# Patient Record
Sex: Male | Born: 1960
Health system: Southern US, Community
[De-identification: ages and names within clinical notes are randomized; demographics above are authoritative.]

## PROBLEM LIST (undated history)

## (undated) DIAGNOSIS — F29 Unspecified psychosis not due to a substance or known physiological condition: Secondary | ICD-10-CM

## (undated) DIAGNOSIS — B1920 Unspecified viral hepatitis C without hepatic coma: Secondary | ICD-10-CM

## (undated) DIAGNOSIS — F1010 Alcohol abuse, uncomplicated: Secondary | ICD-10-CM

## (undated) HISTORY — PX: GASTROSTOMY W/ FEEDING TUBE: SUR642

## (undated) HISTORY — DX: Alcohol abuse, uncomplicated: F10.10

## (undated) HISTORY — PX: SPLENECTOMY: SUR1306

## (undated) HISTORY — PX: OTHER SURGICAL HISTORY: SHX169

## (undated) HISTORY — PX: CHEST TUBE INSERTION: SHX231

## (undated) HISTORY — PX: TRACHEOSTOMY: SUR1362

## (undated) HISTORY — PX: BELOW KNEE LEG AMPUTATION: SUR23

---

## 2000-08-26 ENCOUNTER — Inpatient Hospital Stay (HOSPITAL_COMMUNITY): Admission: EM | Admit: 2000-08-26 | Discharge: 2000-08-29 | Payer: Self-pay | Admitting: *Deleted

## 2004-01-09 ENCOUNTER — Emergency Department (HOSPITAL_COMMUNITY): Admission: EM | Admit: 2004-01-09 | Discharge: 2004-01-09 | Payer: Self-pay | Admitting: Emergency Medicine

## 2007-09-21 ENCOUNTER — Encounter: Payer: Self-pay | Admitting: Family Medicine

## 2008-04-15 ENCOUNTER — Ambulatory Visit: Payer: Self-pay | Admitting: Family Medicine

## 2008-04-19 DIAGNOSIS — F329 Major depressive disorder, single episode, unspecified: Secondary | ICD-10-CM | POA: Insufficient documentation

## 2008-04-19 DIAGNOSIS — I10 Essential (primary) hypertension: Secondary | ICD-10-CM | POA: Insufficient documentation

## 2008-04-19 DIAGNOSIS — F17200 Nicotine dependence, unspecified, uncomplicated: Secondary | ICD-10-CM | POA: Insufficient documentation

## 2010-10-20 NOTE — Letter (Signed)
Summary: rpc chart  rpc chart   Imported By: Curtis Sites 04/14/2010 12:11:50  _____________________________________________________________________  External Attachment:    Type:   Image     Comment:   External Document
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
## 2011-11-16 DIAGNOSIS — L439 Lichen planus, unspecified: Secondary | ICD-10-CM | POA: Diagnosis not present

## 2013-09-25 DIAGNOSIS — R079 Chest pain, unspecified: Secondary | ICD-10-CM | POA: Diagnosis not present

## 2013-09-25 DIAGNOSIS — R071 Chest pain on breathing: Secondary | ICD-10-CM | POA: Diagnosis not present

## 2013-09-25 DIAGNOSIS — R7309 Other abnormal glucose: Secondary | ICD-10-CM | POA: Diagnosis not present

## 2013-09-25 DIAGNOSIS — J4489 Other specified chronic obstructive pulmonary disease: Secondary | ICD-10-CM | POA: Diagnosis not present

## 2013-09-25 DIAGNOSIS — J449 Chronic obstructive pulmonary disease, unspecified: Secondary | ICD-10-CM | POA: Diagnosis not present

## 2013-09-25 DIAGNOSIS — F17200 Nicotine dependence, unspecified, uncomplicated: Secondary | ICD-10-CM | POA: Diagnosis not present

## 2013-09-25 DIAGNOSIS — R799 Abnormal finding of blood chemistry, unspecified: Secondary | ICD-10-CM | POA: Diagnosis not present

## 2013-10-02 ENCOUNTER — Ambulatory Visit (HOSPITAL_COMMUNITY)
Admission: RE | Admit: 2013-10-02 | Discharge: 2013-10-02 | Disposition: A | Discharge status: Home / Self Care | Payer: Medicare Other | Source: Ambulatory Visit | Attending: Internal Medicine | Admitting: Internal Medicine

## 2013-10-02 ENCOUNTER — Ambulatory Visit (HOSPITAL_COMMUNITY): Payer: Self-pay

## 2013-10-02 ENCOUNTER — Other Ambulatory Visit (HOSPITAL_COMMUNITY): Payer: Self-pay | Admitting: Internal Medicine

## 2013-10-02 DIAGNOSIS — R071 Chest pain on breathing: Secondary | ICD-10-CM | POA: Insufficient documentation

## 2013-10-02 DIAGNOSIS — R0789 Other chest pain: Secondary | ICD-10-CM

## 2013-10-02 DIAGNOSIS — J40 Bronchitis, not specified as acute or chronic: Secondary | ICD-10-CM | POA: Diagnosis not present

## 2013-10-02 DIAGNOSIS — R918 Other nonspecific abnormal finding of lung field: Secondary | ICD-10-CM | POA: Diagnosis not present

## 2013-10-08 ENCOUNTER — Other Ambulatory Visit (HOSPITAL_COMMUNITY): Payer: Self-pay | Admitting: Internal Medicine

## 2013-10-08 DIAGNOSIS — R0789 Other chest pain: Secondary | ICD-10-CM

## 2013-10-10 ENCOUNTER — Ambulatory Visit (HOSPITAL_COMMUNITY)
Admission: RE | Admit: 2013-10-10 | Discharge: 2013-10-10 | Disposition: A | Discharge status: Home / Self Care | Payer: Medicare Other | Source: Ambulatory Visit | Attending: Internal Medicine | Admitting: Internal Medicine

## 2013-10-10 DIAGNOSIS — F17200 Nicotine dependence, unspecified, uncomplicated: Secondary | ICD-10-CM | POA: Diagnosis not present

## 2013-10-10 DIAGNOSIS — J438 Other emphysema: Secondary | ICD-10-CM | POA: Diagnosis not present

## 2013-10-10 DIAGNOSIS — I2510 Atherosclerotic heart disease of native coronary artery without angina pectoris: Secondary | ICD-10-CM | POA: Insufficient documentation

## 2013-10-10 DIAGNOSIS — R079 Chest pain, unspecified: Secondary | ICD-10-CM | POA: Insufficient documentation

## 2013-10-10 DIAGNOSIS — R059 Cough, unspecified: Secondary | ICD-10-CM | POA: Insufficient documentation

## 2013-10-10 DIAGNOSIS — R0789 Other chest pain: Secondary | ICD-10-CM

## 2013-10-10 DIAGNOSIS — Q791 Other congenital malformations of diaphragm: Secondary | ICD-10-CM | POA: Insufficient documentation

## 2013-10-10 DIAGNOSIS — R918 Other nonspecific abnormal finding of lung field: Secondary | ICD-10-CM | POA: Insufficient documentation

## 2013-10-10 DIAGNOSIS — R05 Cough: Secondary | ICD-10-CM | POA: Insufficient documentation

## 2013-10-10 MED ORDER — IOHEXOL 300 MG/ML  SOLN
80.0000 mL | Freq: Once | INTRAMUSCULAR | Status: AC | PRN
  Administered 2013-10-10: 80 mL via INTRAVENOUS

## 2013-10-11 DIAGNOSIS — J449 Chronic obstructive pulmonary disease, unspecified: Secondary | ICD-10-CM | POA: Diagnosis not present

## 2013-10-11 DIAGNOSIS — S88119A Complete traumatic amputation at level between knee and ankle, unspecified lower leg, initial encounter: Secondary | ICD-10-CM | POA: Diagnosis not present

## 2014-01-10 DIAGNOSIS — J449 Chronic obstructive pulmonary disease, unspecified: Secondary | ICD-10-CM | POA: Diagnosis not present

## 2014-11-06 DIAGNOSIS — M25561 Pain in right knee: Secondary | ICD-10-CM | POA: Diagnosis not present

## 2014-11-06 DIAGNOSIS — G8918 Other acute postprocedural pain: Secondary | ICD-10-CM | POA: Diagnosis not present

## 2016-06-02 ENCOUNTER — Emergency Department (HOSPITAL_COMMUNITY): Payer: Medicare Other

## 2016-06-02 ENCOUNTER — Encounter (HOSPITAL_COMMUNITY): Payer: Self-pay

## 2016-06-02 ENCOUNTER — Emergency Department (HOSPITAL_COMMUNITY)
Admission: EM | Admit: 2016-06-02 | Discharge: 2016-06-02 | Disposition: A | Discharge status: Home / Self Care | Payer: Medicare Other | Attending: Emergency Medicine | Admitting: Emergency Medicine

## 2016-06-02 DIAGNOSIS — M545 Low back pain: Secondary | ICD-10-CM | POA: Diagnosis not present

## 2016-06-02 DIAGNOSIS — Y929 Unspecified place or not applicable: Secondary | ICD-10-CM | POA: Diagnosis not present

## 2016-06-02 DIAGNOSIS — F17200 Nicotine dependence, unspecified, uncomplicated: Secondary | ICD-10-CM | POA: Diagnosis not present

## 2016-06-02 DIAGNOSIS — Y9301 Activity, walking, marching and hiking: Secondary | ICD-10-CM | POA: Insufficient documentation

## 2016-06-02 DIAGNOSIS — I10 Essential (primary) hypertension: Secondary | ICD-10-CM | POA: Insufficient documentation

## 2016-06-02 DIAGNOSIS — M542 Cervicalgia: Secondary | ICD-10-CM | POA: Insufficient documentation

## 2016-06-02 DIAGNOSIS — Y999 Unspecified external cause status: Secondary | ICD-10-CM | POA: Insufficient documentation

## 2016-06-02 DIAGNOSIS — R030 Elevated blood-pressure reading, without diagnosis of hypertension: Secondary | ICD-10-CM | POA: Diagnosis not present

## 2016-06-02 DIAGNOSIS — S80211A Abrasion, right knee, initial encounter: Secondary | ICD-10-CM | POA: Insufficient documentation

## 2016-06-02 DIAGNOSIS — S3992XA Unspecified injury of lower back, initial encounter: Secondary | ICD-10-CM | POA: Diagnosis not present

## 2016-06-02 DIAGNOSIS — W19XXXA Unspecified fall, initial encounter: Secondary | ICD-10-CM

## 2016-06-02 DIAGNOSIS — W010XXA Fall on same level from slipping, tripping and stumbling without subsequent striking against object, initial encounter: Secondary | ICD-10-CM | POA: Diagnosis not present

## 2016-06-02 DIAGNOSIS — S8991XA Unspecified injury of right lower leg, initial encounter: Secondary | ICD-10-CM | POA: Diagnosis present

## 2016-06-02 DIAGNOSIS — M5489 Other dorsalgia: Secondary | ICD-10-CM | POA: Diagnosis not present

## 2016-06-02 MED ORDER — NAPROXEN 500 MG PO TABS: 500.0000 mg | ORAL_TABLET | Freq: Two times a day (BID) | ORAL | 0 refills | Status: DC

## 2016-06-02 MED ORDER — CYCLOBENZAPRINE HCL 10 MG PO TABS: 10.0000 mg | ORAL_TABLET | Freq: Two times a day (BID) | ORAL | 0 refills | Status: DC | PRN

## 2016-06-02 NOTE — ED Provider Notes (Signed)
[facility] DEPT Provider Note   CSN: [id number] Arrival date & time: 06/02/16  1126  By signing my name below, I, Judithe Modest, attest that this documentation has been prepared under the direction and in the presence of Nat Christen, MD. Electronically Signed: Judithe Modest, ER Scribe. 05/01/2016. 1:24 PM.   History   Chief Complaint Chief Complaint  Patient presents with  . Fall   The history is provided by the patient. No language interpreter was used.    HPI Comments: Jeff Stevenson is a 55 y.o. male who presents to the Emergency Department complaining of an accidental fall this morning. He states he slipped on we cement. He is complaining of neck and lower back pain. No LOC or head trauma. He is also complaining of a small laceration his right lateral knee.   History reviewed. No pertinent past medical history.  Patient Active Problem List   Diagnosis Date Noted  . NICOTINE ADDICTION 04/19/2008  . DEPRESSION 04/19/2008  . HYPERTENSION 04/19/2008    Past Surgical History:  Procedure Laterality Date  . BACK SURGERY    . BELOW KNEE LEG AMPUTATION    . LUNG SURGERY         Home Medications    Prior to Admission medications   Medication Sig Start Date End Date Taking? Authorizing Provider  cyclobenzaprine (FLEXERIL) 10 MG tablet Take 1 tablet (10 mg total) by mouth 2 (two) times daily as needed for muscle spasms. 06/02/16   Nat Christen, MD  naproxen (NAPROSYN) 500 MG tablet Take 1 tablet (500 mg total) by mouth 2 (two) times daily. 06/02/16   Nat Christen, MD    Family History No family history on file.  Social History Social History  Substance Use Topics  . Smoking status: Current Every Day Smoker  . Smokeless tobacco: Never Used  . Alcohol use Yes     Allergies   Review of patient's allergies indicates no known allergies.   Review of Systems Review of Systems  Constitutional: Negative for chills and fever.  Musculoskeletal: Positive for back  pain, myalgias and neck pain.  Skin: Positive for color change and wound.  Neurological: Negative for weakness and numbness.  All other systems reviewed and are negative.   Physical Exam Updated Vital Signs BP 141/97   Pulse 74   Temp 98.2 F (36.8 C) (Oral)   Resp 18   Ht 5\' 7"  (1.702 m)   Wt 145 lb (65.8 kg)   SpO2 93%   BMI 22.71 kg/m   Physical Exam  Constitutional: He is oriented to person, place, and time. He appears well-developed and well-nourished.  HENT:  Head: Normocephalic and atraumatic.  Eyes: Conjunctivae are normal.  Neck: Neck supple.  Cardiovascular: Normal rate and regular rhythm.   Pulmonary/Chest: Effort normal and breath sounds normal.  Abdominal: Soft. Bowel sounds are normal.  Musculoskeletal: Normal range of motion.  TTP powerior cervical and lumbar.   Neurological: He is alert and oriented to person, place, and time.  Skin: Skin is warm and dry.  Abrasion in right lateral knee.  Psychiatric: He has a normal mood and affect. His behavior is normal.  Nursing note and vitals reviewed.    ED Treatments / Results  Labs (all labs ordered are listed, but only abnormal results are displayed) Labs Reviewed - No data to display  EKG  EKG Interpretation None       Radiology Dg Cervical Spine Complete  Result Date: 06/02/2016 CLINICAL DATA:  Low  back and neck pain. EXAM: CERVICAL SPINE - COMPLETE 4+ VIEW COMPARISON:  None FINDINGS: Reversal of normal cervical lordosis. The vertebral body heights are well preserved. Anterolisthesis of C3 on C4 is noted. The facet joints are well-aligned. The prevertebral soft tissue space is normal. Multi level disc space narrowing and ventral spurring is identified. Most advanced at C4-5. IMPRESSION: Moderate multi level degenerative disc disease. Electronically Signed   By: Kerby Moors M.D.   On: 06/02/2016 13:44   Dg Lumbar Spine Complete  Result Date: 06/02/2016 CLINICAL DATA:  55 year old male status post  accidental fall. Neck and lumbar back pain. Initial encounter. EXAM: LUMBAR SPINE - COMPLETE 4+ VIEW COMPARISON:  Chest CT 10/10/2013. FINDINGS: Chronic perhaps posttraumatic dystrophic ossification and fusion of the left lower ribs and left L1 transverse process. Normal lumbar segmentation. Lumbar vertebral height and alignment within normal limits. Visible lower thoracic levels appear stable and intact. Relatively preserved disc spaces. Mild to moderate lower lumbar facet hypertrophy. No pars fracture identified. Grossly intact visible sacrum and SI joints. IVC filter in place, appears stable from the scout view of the prior chest CT. Calcified aortic atherosclerosis. IMPRESSION: 1.  No acute fracture or listhesis identified in the lumbar spine. 2. Probably posttraumatic chronic fusion of the posterior left lower ribs and L1 transverse process. 3.  Calcified aortic atherosclerosis. Electronically Signed   By: Genevie Ann M.D.   On: 06/02/2016 13:42    Procedures Procedures (including critical care time)  Medications Ordered in ED Medications - No data to display   Initial Impression / Assessment and Plan / ED Course  I have reviewed the triage vital signs and the nursing notes.  Pertinent labs & imaging results that were available during my care of the patient were reviewed by me and considered in my medical decision making (see chart for details).  Clinical Course  Status post accidental mechanical fall.  No head trauma. Plain films of cervical and lumbar spine show no fracture. Discharge medications Naprosyn 500 mg of Flexeril 10 mg   Final Clinical Impressions(s) / ED Diagnoses   Final diagnoses:  Fall, initial encounter  Neck pain  Low back pain without sciatica, unspecified back pain laterality    New Prescriptions New Prescriptions   CYCLOBENZAPRINE (FLEXERIL) 10 MG TABLET    Take 1 tablet (10 mg total) by mouth 2 (two) times daily as needed for muscle spasms.   NAPROXEN (NAPROSYN)  500 MG TABLET    Take 1 tablet (500 mg total) by mouth 2 (two) times daily.     I personally performed the services described in this documentation, which was scribed in my presence. The recorded information has been reviewed and is accurate.         Nat Christen, MD 06/02/16 1515

## 2016-06-02 NOTE — Discharge Instructions (Signed)
X-ray showed no fracture. You will be sore for several days. Medication for pain and muscle spasm.

## 2016-06-21 ENCOUNTER — Encounter: Payer: Self-pay | Admitting: Orthopedic Surgery

## 2016-06-21 ENCOUNTER — Ambulatory Visit (INDEPENDENT_AMBULATORY_CARE_PROVIDER_SITE_OTHER): Payer: Medicare Other | Admitting: Orthopedic Surgery

## 2016-06-21 VITALS — BP 132/89 | HR 123 | Wt 152.0 lb

## 2016-06-21 DIAGNOSIS — M545 Low back pain, unspecified: Secondary | ICD-10-CM

## 2016-06-21 NOTE — Patient Instructions (Signed)
Pickup back brace from [employer]  Over-the-counter Tylenol or Advil as appropriate for pain relief

## 2016-06-21 NOTE — Progress Notes (Signed)
Chief complaint back pain and neck pain   HPI   55 year old male history of left below-knee amputation slipped and fell and injured his back and neck. Says his neck is improved but he still has lower back pain.  At initial injury had numbness run through his legs and upper extremities which has now resolved. He would like a back brace.  X-rays were done at the hospital he has an old injury to his lumbar spine. He has cervical disc disease.  He is on Flexeril and naproxen  Complains of back pain without leg pain  Review of Systems  Constitutional: Negative for chills, fever and weight loss.  Respiratory: Negative for shortness of breath.   Cardiovascular: Negative for chest pain.  Neurological: Negative for tingling.    No past medical history on file.  Past Surgical History:  Procedure Laterality Date  . BACK SURGERY    . BELOW KNEE LEG AMPUTATION    . LUNG SURGERY     No family history on file. Social History  Substance Use Topics  . Smoking status: Current Every Day Smoker  . Smokeless tobacco: Never Used  . Alcohol use Yes   No outpatient prescriptions have been marked as taking for the 06/21/16 encounter (Office Visit) with Carole Civil, MD.    BP 132/89   Pulse (!) 123   Wt 152 lb (68.9 kg)   BMI 23.81 kg/m  Left knee below-knee amputation Physical Exam  Constitutional: He is oriented to person, place, and time. He appears well-developed and well-nourished. No distress.  Cardiovascular: Normal rate and intact distal pulses.   Neurological: He is alert and oriented to person, place, and time.  Skin: Skin is warm and dry. No rash noted. He is not diaphoretic. No erythema. No pallor.  Psychiatric: He has a normal mood and affect. His behavior is normal. Judgment and thought content normal.    Ortho Exam He does indeed have tenderness in his lower back has a nonradicular but positive pain when he straight leg raise in the seated position exacerbated by  dorsiflexion of the foot but his nonneurologic and just tightness.  He has normal reflexes in his right ankle and knee skin is normal with an abrasion over the right knee strength is normal in the hip knee and ankle hip knee and ankle are stable and no range of motion deficits are noted except for a slight flexion contracture of the right knee  No sensory deficit in the right leg there is no lower leg for comparison. Distal pulses are intact.  Left knee below-knee amputation with prosthesis  Gait is unremarkable with no gross abnormality and expected for below-knee amputation  X-rays of the cervical spine and lumbar spine were noted in the ER records  ASSESSMENT: My personal interpretation of the images:   Cervical disc disease and lumbar disc disease without acute fracture  Recommend lumbar orthosis  Over-the-counter NSAIDs or Tylenol for pain  Arther Abbott, MD 06/21/2016 2:22 PM

## 2016-07-02 ENCOUNTER — Telehealth: Payer: Self-pay | Admitting: Orthopedic Surgery

## 2016-07-02 NOTE — Telephone Encounter (Signed)
Notes faxed to [facility], durable medical equipment provider per request for lumbar brace prescribed to fax# [phone] (ph [phone])

## 2016-07-08 DIAGNOSIS — M549 Dorsalgia, unspecified: Secondary | ICD-10-CM | POA: Diagnosis not present

## 2016-07-08 DIAGNOSIS — M542 Cervicalgia: Secondary | ICD-10-CM | POA: Diagnosis not present

## 2016-07-08 DIAGNOSIS — J449 Chronic obstructive pulmonary disease, unspecified: Secondary | ICD-10-CM | POA: Diagnosis not present

## 2016-07-20 ENCOUNTER — Ambulatory Visit (HOSPITAL_COMMUNITY): Payer: Medicare Other | Attending: Physical Therapy | Admitting: Physical Therapy

## 2016-08-10 ENCOUNTER — Ambulatory Visit (HOSPITAL_COMMUNITY): Payer: Medicare Other | Attending: Internal Medicine | Admitting: Physical Therapy

## 2016-08-10 DIAGNOSIS — M545 Low back pain, unspecified: Secondary | ICD-10-CM

## 2016-08-10 DIAGNOSIS — R262 Difficulty in walking, not elsewhere classified: Secondary | ICD-10-CM | POA: Diagnosis not present

## 2016-08-10 NOTE — Therapy (Addendum)
[facility] [place], [place], [address] Phone: [phone]   Fax:  [phone]  Physical Therapy Evaluation  Patient Details  Name: Jeff Stevenson MRN: [id number] Date of Birth: 01-02-1961 Referring Provider: Rosita Jeff Stevenson  Encounter Date: 08/10/2016      PT End of Session - 08/10/16 1108    Visit Number 1   Number of Visits 12   Date for PT Re-Evaluation 09/09/16   Authorization Type medicare   Authorization - Visit Number 1   Authorization - Number of Visits 12   PT Start Time 1020   PT Stop Time 1115   PT Time Calculation (min) 55 min   Activity Tolerance Patient tolerated treatment well   Behavior During Therapy [facility] for tasks assessed/performed      No past medical history on file.  Past Surgical History:  Procedure Laterality Date  . BACK SURGERY    . BELOW KNEE LEG AMPUTATION    . LUNG SURGERY      There were no vitals filed for this visit.       Subjective Assessment - 08/10/16 1059    Subjective Mr. Jeff Stevenson states that he fell two months ago in his driveway and has been having back pain ever since. He has been wearing a back brace all the time.    His neck is better but he continues to have headaches about everyday.  He has been sleeping in the recliner due to increased pain; he denies radicular symptoms.  He states that he feels like the mm in between his shoulder blades are cramping about every other day.  He has been referrred to skilled therapy for pain.     Pertinent History Lt BKA;    How long can you sit comfortably? no problem    How long can you stand comfortably? Able to stand an hour    How long can you walk comfortably? 30 minute    Patient Stated Goals Less pain, to be able to do housework, to be able to work 20 hours a week.      Pain Score 7    Pain Location Back   Pain Orientation Lower   Pain Descriptors / Indicators Sharp;Aching   Pain Onset More than a month ago   Pain Frequency  Constant   Aggravating Factors  standing    Pain Relieving Factors ice,    Effect of Pain on Daily Activities increases.             [facility] PT Assessment - 08/10/16 0001      Assessment   Medical Diagnosis lumbar strain    Referring Provider Jeff Stevenson Jeff Stevenson   Onset Date/Surgical Date 06/02/16   Next MD Visit not scheculed   Prior Therapy none     Precautions   Precautions None     Restrictions   Weight Bearing Restrictions No     Balance Screen   Has the patient fallen in the past 6 months Yes   How many times? 2   Has the patient had a decrease in activity level because of a fear of falling?  Yes   Is the patient reluctant to leave their home because of a fear of falling?  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    Leisure  walking dogs and watching TV     Cognition   Overall Cognitive Status Within Functional Limits for tasks assessed     Observation/Other Assessments   Focus on Therapeutic Outcomes (FOTO)  37     Posture/Postural Control   Posture/Postural Control Postural limitations   Postural Limitations Forward head;Decreased lumbar lordosis;Decreased thoracic kyphosis   Posture Comments stands 15 degrees forward flexed      ROM / Strength   AROM / PROM / Strength AROM     AROM   AROM Assessment Site Lumbar   Lumbar Extension to neutral only reps improve pain    Lumbar - Right Side Bend 15 degrees increases back pain     Lumbar - Left Side Bend 10 degrees reps increases back pain                   [facility] Adult PT Treatment/Exercise - 08/10/16 0001      Exercises   Exercises Lumbar     Lumbar Exercises: Supine   Other Supine Lumbar Exercises 1-5 decompression e   Other Supine Lumbar Exercises abdominal 6 pt                 PT Education - 08/10/16 1106    Education  provided Yes   Education Details decompression exercises; abdominal 6 pt; the benefit of smoking cessation    Person(s) Educated Patient   Methods Explanation;Handout;Demonstration   Comprehension Verbalized understanding;Returned demonstration          PT Short Term Goals - 08/10/16 1221      PT SHORT TERM GOAL #1   Title Pt back ROM to be increased to functional limits to allow pt pain to decrease to no greater than a 5/10    Time 3   Period Weeks   Status New     PT SHORT TERM GOAL #2   Title Pt to have weaned away from lumbar brace    Time 3   Period Weeks   Status New     PT SHORT TERM GOAL #3   Title Pt to demonstrate proper body mechanics and posture to decrease stress on lumbar spine    Time 3   Period Weeks           PT Long Term Goals - 08/10/16 1224      PT LONG TERM GOAL #1   Title Pt core mm to be at least a 4/5 to allow pt to be able to complete bed mobility with ease.    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    Time 6   Period Weeks   Status New               Plan - 08/10/16 1115    Clinical Impression  Statement Mr.  Jeff Stevenson is a 55 yo male who fell in his driveway approximately two months ago and is having continued back pain therefore his MD has referred him to physical therapy.  Examination demonstrates increased pain, decreased actitvity tolerance, improper body mechanics, postural dysfunction, decreased mobility,and decreased ROM,.  Mr. Jeff Stevenson will benefit from skilled physical therapy to address these  issues and maximize his functional ability.  Manual muscle testing of LE was not completed due to high level of pain.     Rehab Potential Good   PT Frequency 2x / week   PT Duration 6 weeks   PT Treatment/Interventions ADLs/Self Care Home Management;Therapeutic activities;Therapeutic exercise;Patient/family education;Manual techniques   PT Next Visit Plan begin core stabiliztion exercises including bent knee raise, clam, bridge as well as POE stretch progress to prone, quadriped and standing activity.    PT Home Exercise Plan decompression exercises as well as 6pt abdominal    Consulted and Agree with Plan of Care Patient      Patient will benefit from skilled therapeutic intervention in order to improve the following deficits and impairments:  Abnormal gait, Decreased activity tolerance, Decreased range of motion, Decreased mobility, Difficulty walking, Postural dysfunction, Improper body mechanics  Visit Diagnosis: Acute bilateral low back pain without sciatica - Plan: PT plan of care cert/re-cert  Difficulty in walking, not elsewhere classified - Plan: PT plan of care cert/re-cert      G-Codes - 65/78/46 December 13, 1226    Functional Assessment Tool Used foto   Functional Limitation Other PT primary   Other PT Primary Current Status (N6295) At least 60 percent but less than 80 percent impaired, limited or restricted   Other PT Primary Goal Status (M8413) At least 40 percent but less than 60 percent impaired, limited or restricted       Problem List Patient Active Problem List   Diagnosis Date Noted  . NICOTINE ADDICTION 04/19/2008  . DEPRESSION 04/19/2008  . HYPERTENSION 04/19/2008    Rayetta Humphrey, PT CLT [phone] 08/10/2016, 12:33 PM  [facility] [address] [place], [place], [address] Phone: [phone]   Fax:  [phone]  Name: Jeff Stevenson MRN: [id number] Date of Birth: [birth year] PHYSICAL THERAPY DISCHARGE  SUMMARY  Visits from Start of Care: 1 Current functional level related to goals / functional outcomes: As above   Remaining deficits: As above   Education / Equipment: HEP Plan: Patient agrees to discharge.  Patient goals were not met. Patient is being discharged due to not returning since the last visit.  ?????        Rayetta Humphrey, [place] CLT [phone] 09/02/2017

## 2016-08-17 ENCOUNTER — Ambulatory Visit (HOSPITAL_COMMUNITY): Payer: Medicare Other

## 2016-08-19 ENCOUNTER — Telehealth (HOSPITAL_COMMUNITY): Payer: Self-pay | Admitting: Physical Therapy

## 2016-08-19 ENCOUNTER — Encounter (HOSPITAL_COMMUNITY): Payer: Medicare Other | Admitting: Physical Therapy

## 2016-08-19 NOTE — Telephone Encounter (Signed)
No Show: [facility] reminding pt of missed appointment and reminded him of his next appt on 08/26/16. Left office # and encouraged him to call with questions or concerns.    2:53 PM,08/19/16 Elly Modena PT, [facility] [phone]

## 2016-08-24 ENCOUNTER — Encounter (HOSPITAL_COMMUNITY): Payer: Medicare Other | Admitting: Physical Therapy

## 2016-08-24 DIAGNOSIS — R945 Abnormal results of liver function studies: Secondary | ICD-10-CM | POA: Diagnosis not present

## 2016-08-24 DIAGNOSIS — N189 Chronic kidney disease, unspecified: Secondary | ICD-10-CM | POA: Diagnosis not present

## 2016-08-24 DIAGNOSIS — Z0000 Encounter for general adult medical examination without abnormal findings: Secondary | ICD-10-CM | POA: Diagnosis not present

## 2016-08-24 DIAGNOSIS — J449 Chronic obstructive pulmonary disease, unspecified: Secondary | ICD-10-CM | POA: Diagnosis not present

## 2016-08-24 DIAGNOSIS — M549 Dorsalgia, unspecified: Secondary | ICD-10-CM | POA: Diagnosis not present

## 2016-08-24 DIAGNOSIS — F17200 Nicotine dependence, unspecified, uncomplicated: Secondary | ICD-10-CM | POA: Diagnosis not present

## 2016-08-24 DIAGNOSIS — Z79899 Other long term (current) drug therapy: Secondary | ICD-10-CM | POA: Diagnosis not present

## 2016-08-24 DIAGNOSIS — Z89519 Acquired absence of unspecified leg below knee: Secondary | ICD-10-CM | POA: Diagnosis not present

## 2016-08-26 ENCOUNTER — Telehealth (HOSPITAL_COMMUNITY): Payer: Self-pay

## 2016-08-26 ENCOUNTER — Ambulatory Visit (HOSPITAL_COMMUNITY): Payer: Medicare Other | Attending: Internal Medicine

## 2016-08-26 NOTE — Telephone Encounter (Signed)
No show, called and spoke to pt who stated he does not ability to drive and his mother has been sick so unable to take him.  Today was 2nd no show missed apt, pt educated on policy and educated next apt date and time wiht out contact info given as well as [facility] for transportation [phone].  [address], [place]; [facility] [phone]

## 2016-08-31 ENCOUNTER — Encounter (HOSPITAL_COMMUNITY): Payer: Medicare Other | Admitting: Physical Therapy

## 2016-09-02 ENCOUNTER — Telehealth (HOSPITAL_COMMUNITY): Payer: Self-pay | Admitting: Physical Therapy

## 2016-09-02 ENCOUNTER — Ambulatory Visit (HOSPITAL_COMMUNITY): Payer: Medicare Other | Admitting: Physical Therapy

## 2016-09-02 DIAGNOSIS — J449 Chronic obstructive pulmonary disease, unspecified: Secondary | ICD-10-CM | POA: Diagnosis not present

## 2016-09-02 DIAGNOSIS — B182 Chronic viral hepatitis C: Secondary | ICD-10-CM | POA: Diagnosis not present

## 2016-09-02 DIAGNOSIS — K769 Liver disease, unspecified: Secondary | ICD-10-CM | POA: Diagnosis not present

## 2016-09-02 NOTE — Telephone Encounter (Signed)
Pt third no show discharged pt.  Message left on pt phone that per policy we will be cancelling all future appointments.  Rayetta Humphrey, [place] CLT [phone]

## 2016-09-06 ENCOUNTER — Encounter: Payer: Self-pay | Admitting: Internal Medicine

## 2016-09-07 ENCOUNTER — Encounter (HOSPITAL_COMMUNITY): Payer: Medicare Other | Admitting: Physical Therapy

## 2016-09-09 ENCOUNTER — Ambulatory Visit (HOSPITAL_COMMUNITY): Payer: Medicare Other | Admitting: Physical Therapy

## 2016-09-15 ENCOUNTER — Encounter (HOSPITAL_COMMUNITY): Payer: Medicare Other | Admitting: Physical Therapy

## 2016-09-21 ENCOUNTER — Encounter (HOSPITAL_COMMUNITY): Payer: Medicare Other | Admitting: Physical Therapy

## 2016-09-23 ENCOUNTER — Encounter (HOSPITAL_COMMUNITY): Payer: Medicare Other | Admitting: Physical Therapy

## 2016-10-01 ENCOUNTER — Ambulatory Visit (INDEPENDENT_AMBULATORY_CARE_PROVIDER_SITE_OTHER): Payer: Medicare Other | Admitting: Gastroenterology

## 2016-10-01 ENCOUNTER — Encounter: Payer: Self-pay | Admitting: Gastroenterology

## 2016-10-01 VITALS — BP 151/88 | HR 102 | Temp 97.4°F | Ht 71.0 in | Wt 157.6 lb

## 2016-10-01 DIAGNOSIS — R7989 Other specified abnormal findings of blood chemistry: Secondary | ICD-10-CM | POA: Diagnosis not present

## 2016-10-01 DIAGNOSIS — K769 Liver disease, unspecified: Secondary | ICD-10-CM

## 2016-10-01 DIAGNOSIS — B182 Chronic viral hepatitis C: Secondary | ICD-10-CM | POA: Diagnosis not present

## 2016-10-01 DIAGNOSIS — R945 Abnormal results of liver function studies: Principal | ICD-10-CM

## 2016-10-01 LAB — COMPLETE METABOLIC PANEL WITH GFR
ALT: 101 U/L — ABNORMAL HIGH (ref 9–46)
AST: 85 U/L — ABNORMAL HIGH (ref 10–35)
Albumin: 3.8 g/dL (ref 3.6–5.1)
Alkaline Phosphatase: 114 U/L (ref 40–115)
BUN: 9 mg/dL (ref 7–25)
CO2: 25 mmol/L (ref 20–31)
Calcium: 9.3 mg/dL (ref 8.6–10.3)
Chloride: 100 mmol/L (ref 98–110)
Creat: 0.67 mg/dL — ABNORMAL LOW (ref 0.70–1.33)
GFR, Est African American: >89 mL/min (ref >=60)
GFR, Est Non African American: >89 mL/min (ref >=60)
Glucose, Bld: 92 mg/dL (ref 65–99)
Potassium: 4.4 mmol/L (ref 3.5–5.3)
Sodium: 134 mmol/L — ABNORMAL LOW (ref 135–146)
Total Bilirubin: 0.8 mg/dL (ref 0.2–1.2)
Total Protein: 8 g/dL (ref 6.1–8.1)

## 2016-10-01 LAB — PROTIME-INR
INR: 1
Prothrombin Time: 10.5 s (ref 9.0–11.5)

## 2016-10-01 NOTE — Patient Instructions (Signed)
Please have blood work done today. We will call with results. If the ultrasound shows cirrhosis, we will need to do an upper endoscopy. We can do a colonoscopy at time of the upper endoscopy.  Please have vaccinations done. I have given a prescription for you to complete.  It is very important that you stop drinking.  We will see you back in 2 months!
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
## 2016-10-04 ENCOUNTER — Encounter: Payer: Self-pay | Admitting: Gastroenterology

## 2016-10-05 DIAGNOSIS — R945 Abnormal results of liver function studies: Principal | ICD-10-CM

## 2016-10-05 DIAGNOSIS — R7989 Other specified abnormal findings of blood chemistry: Secondary | ICD-10-CM | POA: Insufficient documentation

## 2016-10-05 NOTE — Progress Notes (Signed)
cc'ed to pcp °

## 2016-10-05 NOTE — Assessment & Plan Note (Addendum)
56 year old male with history of chronic ETOH abuse, elevated transaminases and recently found to have positive Hep C antibody. Mild thrombocytopenia raises suspicion for underlying liver disease. No imaging on file. Will pursue Hep C RNA with genotype, ultrasound with elastography. Discussed alcohol cessation. If further elevated transaminase despite ETOH cessation, pursue further serologies. As of note, Hep B surface antigen and Hep B core antibody negative. After review of labs and elastography, will pursue colonoscopy +/- EGD (if evidence of cirrhosis) with Propofol by Dr. Gala Romney. No prior colonoscopy on file. Further recommendations to follow shortly. Hep A/B vaccination prescription provided to have done through PCP. Return in 2 months.

## 2016-10-07 NOTE — Progress Notes (Signed)
Positive Hep C. Genotype still pending. Can we check to see what is going on with this? Awaiting elastography in future.

## 2016-10-08 ENCOUNTER — Ambulatory Visit (HOSPITAL_COMMUNITY): Admission: RE | Admit: 2016-10-08 | Payer: Medicare Other | Source: Ambulatory Visit

## 2016-10-08 DIAGNOSIS — J449 Chronic obstructive pulmonary disease, unspecified: Secondary | ICD-10-CM | POA: Diagnosis not present

## 2016-10-08 DIAGNOSIS — B182 Chronic viral hepatitis C: Secondary | ICD-10-CM | POA: Diagnosis not present

## 2016-10-08 DIAGNOSIS — K709 Alcoholic liver disease, unspecified: Secondary | ICD-10-CM | POA: Diagnosis not present

## 2016-10-08 LAB — HCV RNA, QN PCR RFLX GENO, LIPA
HCV RNA, PCR, QN: 227930 IU/mL — ABNORMAL HIGH (ref <15)
HCV RNA, PCR, QN: 5.36 log IU/mL — ABNORMAL HIGH (ref <1.18)

## 2016-10-08 LAB — HEPATITIS C GENOTYPE

## 2016-10-15 ENCOUNTER — Ambulatory Visit (HOSPITAL_COMMUNITY)
Admission: RE | Admit: 2016-10-15 | Discharge: 2016-10-15 | Disposition: A | Discharge status: Home / Self Care | Payer: Medicare Other | Source: Ambulatory Visit | Attending: Gastroenterology | Admitting: Gastroenterology

## 2016-10-15 DIAGNOSIS — B1920 Unspecified viral hepatitis C without hepatic coma: Secondary | ICD-10-CM | POA: Insufficient documentation

## 2016-10-15 DIAGNOSIS — K760 Fatty (change of) liver, not elsewhere classified: Secondary | ICD-10-CM | POA: Diagnosis not present

## 2016-10-15 DIAGNOSIS — R7989 Other specified abnormal findings of blood chemistry: Secondary | ICD-10-CM | POA: Insufficient documentation

## 2016-10-18 NOTE — Progress Notes (Signed)
Chronic Hep C, genotype 1a. Needs treatment but needs to stop ETOH first. Could treat with Harvoni; however, he will need to stop drinking. Metavir score F2/F3. No need for EGD now but could arrange screening colonoscopy with Propofol by Dr. Gala Romney now if he is willing.

## 2016-10-19 ENCOUNTER — Other Ambulatory Visit: Payer: Self-pay

## 2016-10-19 DIAGNOSIS — Z1211 Encounter for screening for malignant neoplasm of colon: Secondary | ICD-10-CM

## 2016-10-19 MED ORDER — PEG 3350-KCL-NA BICARB-NACL 420 G PO SOLR: 4000.0000 mL | ORAL | 0 refills | Status: DC

## 2016-10-22 NOTE — Progress Notes (Signed)
Let's have him come in and see me for no charge to update H&P

## 2016-11-02 ENCOUNTER — Encounter (HOSPITAL_COMMUNITY): Payer: Self-pay | Admitting: Registered Nurse

## 2016-11-09 NOTE — Patient Instructions (Signed)
Your procedure is scheduled on: 11/15/2016  Report to [facility] at  7:00  AM.  Call this number if you have problems the morning of surgery: [phone]   Remember:   Do not drink or eat food:After Midnight.  :  Take these medicines the morning of surgery with A SIP OF WATER: none              Use symbicort inhaler   Do not wear jewelry, make-up or nail polish.  Do not wear lotions, powders, or perfumes. You may wear deodorant.  Do not shave 48 hours prior to surgery. Men may shave face and neck.  Do not bring valuables to the hospital.  Contacts, dentures or bridgework may not be worn into surgery.  Leave suitcase in the car. After surgery it may be brought to your room.  For patients admitted to the hospital, checkout time is 11:00 AM the day of discharge.   Patients discharged the day of surgery will not be allowed to drive home.     Colonoscopy, Adult, Care After This sheet gives you information about how to care for yourself after your procedure. Your health care provider may also give you more specific instructions. If you have problems or questions, contact your health care provider. What can I expect after the procedure? After the procedure, it is common to have:  A small amount of blood in your stool for 24 hours after the procedure.  Some gas.  Mild abdominal cramping or bloating. Follow these instructions at home: General instructions  For the first 24 hours after the procedure:  Do not drive or use machinery.  Do not sign important documents.  Do not drink alcohol.  Do your regular daily activities at a slower pace than normal.  Eat soft, easy-to-digest foods.  Rest often.  Take over-the-counter or prescription medicines only as told by your health care provider.  It is up to you to get the results of your procedure. Ask your health care provider, or the department performing the procedure, when your results will be ready. Relieving cramping and  bloating  Try walking around when you have cramps or feel bloated.  Apply heat to your abdomen as told by your health care provider. Use a heat source that your health care provider recommends, such as a moist heat pack or a heating pad.  Place a towel between your skin and the heat source.  Leave the heat on for 20-30 minutes.  Remove the heat if your skin turns bright red. This is especially important if you are unable to feel pain, heat, or cold. You may have a greater risk of getting burned. Eating and drinking  Drink enough fluid to keep your urine clear or pale yellow.  Resume your normal diet as instructed by your health care provider. Avoid heavy or fried foods that are hard to digest.  Avoid drinking alcohol for as long as instructed by your health care provider. Contact a health care provider if:  You have blood in your stool 2-3 days after the procedure. Get help right away if:  You have more than a small spotting of blood in your stool.  You pass large blood clots in your stool.  Your abdomen is swollen.  You have nausea or vomiting.  You have a fever.  You have increasing abdominal pain that is not relieved with medicine. This information is not intended to replace advice given to you by your health care provider. Make sure  you discuss any questions you have with your health care provider. Document Released: 04/20/2004 Document Revised: 05/31/2016 Document Reviewed: 11/18/2015 Elsevier Interactive Patient Education  2017 [employer].

## 2016-11-10 ENCOUNTER — Encounter: Payer: Self-pay | Admitting: Gastroenterology

## 2016-11-10 ENCOUNTER — Encounter (HOSPITAL_COMMUNITY)
Admission: RE | Admit: 2016-11-10 | Discharge: 2016-11-10 | Disposition: A | Discharge status: Home / Self Care | Payer: Medicare Other | Source: Ambulatory Visit | Attending: Internal Medicine | Admitting: Internal Medicine

## 2016-11-10 ENCOUNTER — Ambulatory Visit (INDEPENDENT_AMBULATORY_CARE_PROVIDER_SITE_OTHER): Payer: Self-pay | Admitting: Gastroenterology

## 2016-11-10 ENCOUNTER — Encounter (HOSPITAL_COMMUNITY): Payer: Self-pay

## 2016-11-10 DIAGNOSIS — Z01810 Encounter for preprocedural cardiovascular examination: Secondary | ICD-10-CM | POA: Diagnosis not present

## 2016-11-10 DIAGNOSIS — Z01812 Encounter for preprocedural laboratory examination: Secondary | ICD-10-CM | POA: Diagnosis not present

## 2016-11-10 DIAGNOSIS — B182 Chronic viral hepatitis C: Secondary | ICD-10-CM | POA: Insufficient documentation

## 2016-11-10 HISTORY — DX: Unspecified viral hepatitis C without hepatic coma: B19.20

## 2016-11-10 LAB — CBC
HCT: 53.1 % — ABNORMAL HIGH (ref 39.0–52.0)
Hemoglobin: 18.7 g/dL — ABNORMAL HIGH (ref 13.0–17.0)
MCH: 34.4 pg — ABNORMAL HIGH (ref 26.0–34.0)
MCHC: 35.2 g/dL (ref 30.0–36.0)
MCV: 97.6 fL (ref 78.0–100.0)
Platelets: 157 10*3/uL (ref 150–400)
RBC: 5.44 MIL/uL (ref 4.22–5.81)
RDW: 12.4 % (ref 11.5–15.5)
WBC: 9.3 10*3/uL (ref 4.0–10.5)

## 2016-11-10 LAB — BASIC METABOLIC PANEL
Anion gap: 8 (ref 5–15)
BUN: 9 mg/dL (ref 6–20)
CO2: 23 mmol/L (ref 22–32)
Calcium: 9 mg/dL (ref 8.9–10.3)
Chloride: 101 mmol/L (ref 101–111)
Creatinine, Ser: 0.68 mg/dL (ref 0.61–1.24)
GFR calc Af Amer: >60 mL/min (ref >60)
GFR calc non Af Amer: >60 mL/min (ref >60)
Glucose, Bld: 94 mg/dL (ref 65–99)
Potassium: 4 mmol/L (ref 3.5–5.1)
Sodium: 132 mmol/L — ABNORMAL LOW (ref 135–145)

## 2016-11-10 NOTE — Progress Notes (Signed)
    Referring Provider: Rosita Fire, MD Primary Care Physician:  Rosita Fire, MD Primary GI: Dr. Gala Romney   Chief Complaint  Patient presents with  . Elevated LFT'S    HPI:   Jeff Stevenson is a 56 y.o. male presenting today with a history of chronic Hep C genotype 1a,, ETOH use. Metavir score F2/F3. Here to discuss screening colonoscopy.   In process of completing vaccinations. States he drank a 40 right before the vaccination because he couldn't stand injections. States he hasn't had anything other than that.   Does not want to pursue a colonoscopy right now but will think about it.   Past Medical History:  Diagnosis Date  . ETOH abuse   . Hepatitis C     Past Surgical History:  Procedure Laterality Date  . BELOW KNEE LEG AMPUTATION    . broke his back    . CHEST TUBE INSERTION    . GASTROSTOMY W/ FEEDING TUBE     and removal  . SPLENECTOMY    . TRACHEOSTOMY     and removal     Current Outpatient Prescriptions  Medication Sig Dispense Refill  . SYMBICORT 160-4.5 MCG/ACT inhaler Inhale 2 puffs into the lungs 2 (two) times daily.     No current facility-administered medications for this visit.     Allergies as of 11/10/2016  . (No Known Allergies)    Family History  Problem Relation Age of Onset  . Breast cancer Mother   . Colon cancer Neg Hx     Social History   Social History  . Marital status: Divorced    Spouse name: N/A  . Number of children: N/A  . Years of education: N/A   Social History Main Topics  . Smoking status: Current Every Day Smoker    Packs/day: 1.00  . Smokeless tobacco: Never Used  . Alcohol use Yes     Comment: two 40s a day   . Drug use: Yes    Types: Marijuana     Comment: history of intranasal cocaine, no IV drug use   . Sexual activity: Not Asked   Other Topics Concern  . None   Social History Narrative  . None    Review of Systems: Negative unless mentioned in HPI   Physical Exam: BP (!) 140/91   Pulse 96    Temp 98 F (36.7 C) (Oral)   Ht 5\' 11"  (1.803 m)   Wt 157 lb 9.6 oz (71.5 kg)   BMI 21.98 kg/m  General:   Alert and oriented. No distress noted. Pleasant and cooperative.  Head:  Normocephalic and atraumatic. Abdomen:  +BS, soft, non-tender and non-distended. No rebound or guarding. No HSM or masses noted. Extremities:   LLE prosthesis Neurologic:  Alert and  oriented x4;  grossly normal neurologically. Psych:  Alert and cooperative. Normal mood and affect.  Lab Results  Component Value Date   ALT 101 (H) 10/01/2016   AST 85 (H) 10/01/2016   ALKPHOS 114 10/01/2016   BILITOT 0.8 10/01/2016   Lab Results  Component Value Date   WBC 9.3 11/10/2016   HGB 18.7 (H) 11/10/2016   HCT 53.1 (H) 11/10/2016   MCV 97.6 11/10/2016   PLT 157 11/10/2016   Lab Results  Component Value Date   CREATININE 0.68 11/10/2016   BUN 9 11/10/2016   NA 132 (L) 11/10/2016   K 4.0 11/10/2016   CL 101 11/10/2016   CO2 23 11/10/2016

## 2016-11-10 NOTE — Patient Instructions (Signed)
We will cancel the colonoscopy.  I will be in touch in the next 24-48 hours with the treatment plan.

## 2016-11-12 ENCOUNTER — Ambulatory Visit: Payer: Medicare Other | Admitting: Gastroenterology

## 2016-11-15 ENCOUNTER — Ambulatory Visit (HOSPITAL_COMMUNITY): Admission: RE | Admit: 2016-11-15 | Payer: Medicare Other | Source: Ambulatory Visit | Admitting: Internal Medicine

## 2016-11-15 ENCOUNTER — Encounter (HOSPITAL_COMMUNITY): Admission: RE | Payer: Self-pay | Source: Ambulatory Visit

## 2016-11-15 SURGERY — COLONOSCOPY WITH PROPOFOL
Anesthesia: Monitor Anesthesia Care

## 2016-11-15 NOTE — Assessment & Plan Note (Signed)
Genotype 1a, Metavir score F2/F3. Discussed Harvoni treatment and need to avoid ETOH during treatment and strongly recommend indefinitely due to liver disease. He recently drank a "40" right before hepatitis vaccination due to anxiety regarding injection. Discussed we can not treat while actively drinking ETOH. I will discuss with a colleague (Dawn Drazek) in [place], as I feel he might need to be enrolled in some type of therapy at least while under treatment. Colonoscopy cancelled for now at his request. Further recommendations to follow.

## 2016-11-16 NOTE — Progress Notes (Signed)
cc'ed to pcp °

## 2016-11-17 NOTE — Progress Notes (Signed)
I reviewed case with Dawn Drazek. It is good he has cut down on drinking, but I would like for him to avoid ETOH for 3 months and then come see me. Let's make an appointment for May 2018. Insurance won't cover for treatment due to ETOH use right now. If he feels he needs help with this, we can refer; however, he states he has stopped on his own before without problems.

## 2016-11-22 NOTE — Progress Notes (Signed)
Tried to call pt, NA.

## 2016-11-24 DIAGNOSIS — B182 Chronic viral hepatitis C: Secondary | ICD-10-CM | POA: Diagnosis not present

## 2016-11-24 DIAGNOSIS — F17218 Nicotine dependence, cigarettes, with other nicotine-induced disorders: Secondary | ICD-10-CM | POA: Diagnosis not present

## 2016-11-24 DIAGNOSIS — J449 Chronic obstructive pulmonary disease, unspecified: Secondary | ICD-10-CM | POA: Diagnosis not present

## 2016-11-24 DIAGNOSIS — F339 Major depressive disorder, recurrent, unspecified: Secondary | ICD-10-CM | POA: Diagnosis not present

## 2016-11-24 NOTE — Progress Notes (Signed)
Tried to call pt- NA 

## 2016-11-26 NOTE — Progress Notes (Signed)
Letter mailed to the pt. Please schedule ov. 

## 2016-11-26 NOTE — Progress Notes (Signed)
MOVED APPOINTMENT TO MAY AND NOTIFIED PATIENT

## 2016-12-01 ENCOUNTER — Ambulatory Visit: Payer: Medicare Other | Admitting: Gastroenterology

## 2017-01-03 DIAGNOSIS — J449 Chronic obstructive pulmonary disease, unspecified: Secondary | ICD-10-CM | POA: Diagnosis not present

## 2017-01-03 DIAGNOSIS — K709 Alcoholic liver disease, unspecified: Secondary | ICD-10-CM | POA: Diagnosis not present

## 2017-01-03 DIAGNOSIS — B182 Chronic viral hepatitis C: Secondary | ICD-10-CM | POA: Diagnosis not present

## 2017-01-03 DIAGNOSIS — F17218 Nicotine dependence, cigarettes, with other nicotine-induced disorders: Secondary | ICD-10-CM | POA: Diagnosis not present

## 2017-01-31 ENCOUNTER — Telehealth: Payer: Self-pay

## 2017-01-31 ENCOUNTER — Other Ambulatory Visit: Payer: Self-pay

## 2017-01-31 ENCOUNTER — Ambulatory Visit (INDEPENDENT_AMBULATORY_CARE_PROVIDER_SITE_OTHER): Payer: Medicare Other | Admitting: Gastroenterology

## 2017-01-31 VITALS — BP 154/99 | HR 88 | Temp 97.9°F | Ht 71.0 in | Wt 150.2 lb

## 2017-01-31 DIAGNOSIS — Z9289 Personal history of other medical treatment: Secondary | ICD-10-CM | POA: Diagnosis not present

## 2017-01-31 DIAGNOSIS — B182 Chronic viral hepatitis C: Secondary | ICD-10-CM | POA: Diagnosis not present

## 2017-01-31 DIAGNOSIS — Z1211 Encounter for screening for malignant neoplasm of colon: Secondary | ICD-10-CM | POA: Diagnosis not present

## 2017-01-31 MED ORDER — NA SULFATE-K SULFATE-MG SULF 17.5-3.13-1.6 GM/177ML PO SOLN: 1.0000 | ORAL | 0 refills | Status: DC

## 2017-01-31 NOTE — Telephone Encounter (Signed)
Called pt to inform of pre-op appt 03/02/17 at 9:00am. He gave phone to his mom for me to tell her. Letter also mailed.

## 2017-01-31 NOTE — Patient Instructions (Addendum)
Please have blood work done today.   We have scheduled you for a colonoscopy with Dr. Gala Romney for routine purposes.  We will submit for Hepatitis C treatment with Harvoni and let you know when the medication is ready.

## 2017-01-31 NOTE — Progress Notes (Signed)
  Referring Provider: Fanta, Tesfaye, MD Primary Care Physician:  Fanta, Tesfaye, MD  Primary GI: Dr. Rourk   Chief Complaint  Patient presents with  . Hepatitis C    HPI:   Jeff Stevenson is a 56 y.o. male presenting today with a history of Hep C genotype 1a, Metavir score F2/F3, history of ETOH use. Last seen in Feb 2018 and instructed to abstain from drinking in order to proceed with treatment. Needs initial screening colonoscopy but declined at last visit. No further ETOH. He has abstained since that visit in Feb 2018, when he was told treatment was not able to be undertaken if drinking. States he called his mom and said he needed help to quit drinking, and he moved in with her. He does not drink now, since he lives with her. States he mainly drank to sleep, and now he has been started on trazodone. This has helped with sleeping.  Last of vaccination series due in July.   No solid food or liquid dysphagia. No abdominal pain, N/V, constipation, diarrhea. Would like to pursue screening colonoscopy. Mother present during visit.   Past Medical History:  Diagnosis Date  . ETOH abuse   . Hepatitis C     Past Surgical History:  Procedure Laterality Date  . BELOW KNEE LEG AMPUTATION    . broke his back    . CHEST TUBE INSERTION    . GASTROSTOMY W/ FEEDING TUBE     and removal  . SPLENECTOMY    . TRACHEOSTOMY     and removal     Current Outpatient Prescriptions  Medication Sig Dispense Refill  . SYMBICORT 160-4.5 MCG/ACT inhaler Inhale 2 puffs into the lungs 2 (two) times daily.    . traZODone (DESYREL) 50 MG tablet     . Na Sulfate-K Sulfate-Mg Sulf (SUPREP BOWEL PREP KIT) 17.5-3.13-1.6 GM/180ML SOLN Take 1 kit by mouth as directed. 1 Bottle 0   No current facility-administered medications for this visit.     Allergies as of 01/31/2017  . (No Known Allergies)    Family History  Problem Relation Age of Onset  . Breast cancer Mother   . Colon cancer Neg Hx     Social  History   Social History  . Marital status: Divorced    Spouse name: N/A  . Number of children: N/A  . Years of education: N/A   Social History Main Topics  . Smoking status: Current Every Day Smoker    Packs/day: 1.00  . Smokeless tobacco: Never Used  . Alcohol use No     Comment: beer historically  . Drug use: No     Comment: history of intranasal cocaine, no IV drug use, none currently   . Sexual activity: Not on file   Other Topics Concern  . Not on file   Social History Narrative  . No narrative on file    Review of Systems: Gen: Denies fever, chills, anorexia. Denies fatigue, weakness, weight loss.  CV: Denies chest pain, palpitations, syncope, peripheral edema, and claudication. Resp: Denies dyspnea at rest, cough, wheezing, coughing up blood, and pleurisy. GI: see HPI  Derm: Denies rash, itching, dry skin Psych: Denies depression, anxiety, memory loss, confusion. No homicidal or suicidal ideation.  Heme: Denies bruising, bleeding, and enlarged lymph nodes.  Physical Exam: BP (!) 154/99   Pulse 88   Temp 97.9 F (36.6 C) (Oral)   Ht 5' 11" (1.803 m)   Wt 150 lb 3.2 oz (68.1   kg)   BMI 20.95 kg/m  General:   Alert and oriented. No distress noted. Ruddy complexion  Head:  Normocephalic and atraumatic. Eyes:  Conjuctiva clear without scleral icterus. Mouth:  Oral mucosa pink and moist. Good dentition. No lesions. Heart:  S1, S2 present without murmurs, rubs, or gallops. Regular rate and rhythm. Abdomen:  +BS, soft, non-tender and non-distended. No rebound or guarding. No HSM or masses noted. Msk:  Symmetrical without gross deformities. Normal posture. Extremities:  Without edema. Neurologic:  Alert and  oriented x4;  grossly normal neurologically. Psych:  Alert and cooperative. Normal mood and affect.

## 2017-02-01 ENCOUNTER — Encounter: Payer: Self-pay | Admitting: Gastroenterology

## 2017-02-01 ENCOUNTER — Telehealth: Payer: Self-pay | Admitting: Gastroenterology

## 2017-02-01 LAB — CBC WITH DIFFERENTIAL/PLATELET
Basophils Absolute: 65 cells/uL (ref 0–200)
Basophils Relative: 1 %
Eosinophils Absolute: 130 cells/uL (ref 15–500)
Eosinophils Relative: 2 %
HCT: 48.3 % (ref 38.5–50.0)
Hemoglobin: 16.9 g/dL (ref 13.2–17.1)
Lymphocytes Relative: 27 %
Lymphs Abs: 1755 cells/uL (ref 850–3900)
MCH: 33.7 pg — ABNORMAL HIGH (ref 27.0–33.0)
MCHC: 35 g/dL (ref 32.0–36.0)
MCV: 96.2 fL (ref 80.0–100.0)
MPV: 11.9 fL (ref 7.5–12.5)
Monocytes Absolute: 1105 cells/uL — ABNORMAL HIGH (ref 200–950)
Monocytes Relative: 17 %
Neutro Abs: 3445 cells/uL (ref 1500–7800)
Neutrophils Relative %: 53 %
Platelets: 121 10*3/uL — ABNORMAL LOW (ref 140–400)
RBC: 5.02 MIL/uL (ref 4.20–5.80)
RDW: 13.3 % (ref 11.0–15.0)
WBC: 6.5 10*3/uL (ref 3.8–10.8)

## 2017-02-01 LAB — COMPLETE METABOLIC PANEL WITH GFR
ALT: 131 U/L — ABNORMAL HIGH (ref 9–46)
AST: 180 U/L — ABNORMAL HIGH (ref 10–35)
Albumin: 4 g/dL (ref 3.6–5.1)
Alkaline Phosphatase: 115 U/L (ref 40–115)
BUN: 6 mg/dL — ABNORMAL LOW (ref 7–25)
CO2: 24 mmol/L (ref 20–31)
Calcium: 9 mg/dL (ref 8.6–10.3)
Chloride: 103 mmol/L (ref 98–110)
Creat: 0.49 mg/dL — ABNORMAL LOW (ref 0.70–1.33)
GFR, Est African American: >89 mL/min (ref >=60)
GFR, Est Non African American: >89 mL/min (ref >=60)
Glucose, Bld: 96 mg/dL (ref 65–99)
Potassium: 4.2 mmol/L (ref 3.5–5.3)
Sodium: 136 mmol/L (ref 135–146)
Total Bilirubin: 0.9 mg/dL (ref 0.2–1.2)
Total Protein: 7.8 g/dL (ref 6.1–8.1)

## 2017-02-01 LAB — PROTIME-INR
INR: 1
Prothrombin Time: 10.6 s (ref 9.0–11.5)

## 2017-02-01 NOTE — Progress Notes (Signed)
cc'ed to pcp °

## 2017-02-01 NOTE — Assessment & Plan Note (Signed)
Overdue for routine screening colonoscopy. No concerning lower GI symptoms.  Proceed with TCS with Dr. Gala Romney in near future: the risks, benefits, and alternatives have been discussed with the patient in detail. The patient states understanding and desires to proceed. PROPOFOL due to history of ETOH use

## 2017-02-01 NOTE — Assessment & Plan Note (Signed)
56 year old male with genotype 1a, Metavir score F2/F3. Has remained abstinent from alcohol and moved in with his mother for morale support. Last of vaccination series due in July. Will attempt to submit for Harvoni X 12 weeks. After review labs, also need to have HIV status on file.

## 2017-02-01 NOTE — Telephone Encounter (Signed)
Jeff Stevenson:  Can we look again at how to get HIV antibody ordered for patient? Thanks!

## 2017-02-08 ENCOUNTER — Other Ambulatory Visit: Payer: Self-pay | Admitting: Gastroenterology

## 2017-02-08 DIAGNOSIS — B182 Chronic viral hepatitis C: Secondary | ICD-10-CM

## 2017-02-08 DIAGNOSIS — Z114 Encounter for screening for human immunodeficiency virus [HIV]: Secondary | ICD-10-CM

## 2017-02-09 ENCOUNTER — Other Ambulatory Visit: Payer: Self-pay

## 2017-02-09 DIAGNOSIS — Z114 Encounter for screening for human immunodeficiency virus [HIV]: Secondary | ICD-10-CM

## 2017-02-09 NOTE — Telephone Encounter (Signed)
Late entry- I spoke with the pt yesterday, he has not had this done anywhere else. I pulled up the medicare guidelines and found the HIV section. Orders for HIV antibody have been put in and mailed to the pt to have it done. He is aware I cannot order the medication until this blood work is done.

## 2017-02-10 ENCOUNTER — Other Ambulatory Visit: Payer: Self-pay

## 2017-02-10 ENCOUNTER — Other Ambulatory Visit: Payer: Self-pay | Admitting: Gastroenterology

## 2017-02-10 DIAGNOSIS — B182 Chronic viral hepatitis C: Secondary | ICD-10-CM

## 2017-02-10 NOTE — Progress Notes (Signed)
LFTs fluctuating. Known chronic Hep C, history of ETOH use in past but states he has not been drinking for some time. Hep B surface antigen negative, Hep B core antibody negative. To be thorough, let's go ahead and check iron, ferritin, TIBC, ceruloplasmin, alpha-1 antitrypsin, AMA, ANA, ASMA. Likely his LFTs are secondary to chronic Hep C, but we need to get these on file.

## 2017-02-16 NOTE — Progress Notes (Signed)
Awaiting HIV lab and then submitting for [place].

## 2017-02-22 DIAGNOSIS — B182 Chronic viral hepatitis C: Secondary | ICD-10-CM | POA: Diagnosis not present

## 2017-02-22 DIAGNOSIS — Z114 Encounter for screening for human immunodeficiency virus [HIV]: Secondary | ICD-10-CM | POA: Diagnosis not present

## 2017-02-22 DIAGNOSIS — R945 Abnormal results of liver function studies: Secondary | ICD-10-CM | POA: Diagnosis not present

## 2017-02-22 DIAGNOSIS — I10 Essential (primary) hypertension: Secondary | ICD-10-CM | POA: Diagnosis not present

## 2017-02-22 DIAGNOSIS — J449 Chronic obstructive pulmonary disease, unspecified: Secondary | ICD-10-CM | POA: Diagnosis not present

## 2017-02-22 DIAGNOSIS — C44321 Squamous cell carcinoma of skin of nose: Secondary | ICD-10-CM | POA: Diagnosis not present

## 2017-02-22 DIAGNOSIS — Z0000 Encounter for general adult medical examination without abnormal findings: Secondary | ICD-10-CM | POA: Diagnosis not present

## 2017-02-28 DIAGNOSIS — Z114 Encounter for screening for human immunodeficiency virus [HIV]: Secondary | ICD-10-CM | POA: Diagnosis not present

## 2017-02-28 DIAGNOSIS — B182 Chronic viral hepatitis C: Secondary | ICD-10-CM | POA: Diagnosis not present

## 2017-02-28 LAB — IRON AND TIBC
%SAT: 79 % — ABNORMAL HIGH (ref 15–60)
Iron: 321 ug/dL — ABNORMAL HIGH (ref 50–180)
TIBC: 406 ug/dL (ref 250–425)
UIBC: 85 ug/dL

## 2017-02-28 LAB — HIV ANTIBODY (ROUTINE TESTING W REFLEX): HIV 1&2 Ab, 4th Generation: NONREACTIVE

## 2017-03-01 ENCOUNTER — Encounter: Payer: Self-pay | Admitting: General Practice

## 2017-03-01 ENCOUNTER — Telehealth: Payer: Self-pay

## 2017-03-01 LAB — ANA: Anti Nuclear Antibody(ANA): NEGATIVE

## 2017-03-01 LAB — FERRITIN: Ferritin: 367 ng/mL (ref 20–380)

## 2017-03-01 NOTE — Telephone Encounter (Signed)
Pt called and wants to know when he can get the medication for the Hep C.  He completed his blood work yesterday.

## 2017-03-01 NOTE — Telephone Encounter (Signed)
Patient contacted our office and was anxious on the phone, talking about liver disease and if he would die from hepatitis C. He stated he that he "didn't want to do any more tests and just want to die". The office manager was talking with him, and I offered to speak with him, as I have seen him in clinic previously and have an established patient/provider relationship with him.  When I got on the phone, he sounded upset and emotional regarding his diagnosis of Hepatitis C. He was not conversing in a way that I have experienced with him before, as during office visits he has always been calm and rational. I explicitly asked him if he had been drinking, and he said "just one beer and a joint".   He was wanting his lab results, which were just drawn yesterday. I discussed that not all of them were back yet, and he persisted to ask why more had been ordered, to which I replied he had a bump in his LFTs. This was to be thorough for his sake.   He continued to ramble and not make sense, and then states" I am just joking with you. I didn't drink anything and I didn't smoke", followed by hysterical laughter. During this time, he was talking about seeing a therapist, his time in the war, and then states "I wanted to test you and people like you to see how you would treat me if I told you I had been drinking". To this, I replied that joking about something serious that affects his health and candidacy for Hep C treatment is unacceptable and results in a lack of trust in this relationship. I told him I no longer felt comfortable treating him but that for his health, he certainly should pursue treatment elsewhere for Hep C but we could cover basic GI care.   He became louder and stated things such as "you don't want to push my buttons, people are watching you and you just don't know, I am recording you".   He then stated vaguely, "You will be treated...you are your guy, you will be treated".   After this, he hung up  abruptly.  I called him back immediately, and I asked if I could speak with his mother, as he lives with her. I could hear her in the background during the conversation. She stated he had been drinking, apologized multiple times, and I told her that the provider/patient relationship was not effective any longer and due to his threats and behavior, he would need to be seen at a different GI practice. I told her he would need to obtain a referral from his PCP to be seen elsewhere. I discussed with her that I had several pending labs from yesterday, and I would contact her with these results. I also informed her the procedure would be cancelled (routine screening colonoscopy) that is upcoming next week. I again stressed to her that he needs care for chronic Hepatitis C, but we would not be able to provide this due to these most recent events. She again apologized.   Procedure has been cancelled for 6/18. Labs will be addressed with his mother when all available.

## 2017-03-01 NOTE — Telephone Encounter (Signed)
Routing to Anna

## 2017-03-02 ENCOUNTER — Encounter (HOSPITAL_COMMUNITY): Admission: RE | Admit: 2017-03-02 | Payer: Medicare Other | Source: Ambulatory Visit

## 2017-03-02 LAB — ALPHA-1-ANTITRYPSIN: A-1 Antitrypsin, Ser: 199 mg/dL (ref 83–199)

## 2017-03-02 LAB — ANTI-SMOOTH MUSCLE ANTIBODY, IGG: Smooth Muscle Ab: 32 U — ABNORMAL HIGH (ref <20)

## 2017-03-02 LAB — CERULOPLASMIN: Ceruloplasmin: 29 mg/dL (ref 18–36)

## 2017-03-02 LAB — MITOCHONDRIAL ANTIBODIES: Mitochondrial M2 Ab, IgG: 27.8 Units — ABNORMAL HIGH (ref <=20.0)

## 2017-03-02 NOTE — Telephone Encounter (Signed)
Jeff Stevenson is aware to cancel pre-op appointment

## 2017-03-02 NOTE — Telephone Encounter (Signed)
Ida at Dr. Josephine Cables office called and she was aware that pt was discharged from our office. She wanted to know if there was somewhere in [place] that she could refer pt to for Hep C. AB advised, Roosevelt Locks NP at [place]. Gave phone number and fax number to [facility]. She also requested his labs be faxed to their office. Pt's lab results faxed to Dr. Josephine Cables office.

## 2017-03-02 NOTE — Telephone Encounter (Signed)
Routing to Ginger.  

## 2017-03-03 NOTE — Telephone Encounter (Signed)
Update on communication. I reviewed the remainder of the documentation. Things look well documented.  No further action needed.

## 2017-03-04 ENCOUNTER — Emergency Department (HOSPITAL_COMMUNITY)
Admission: EM | Admit: 2017-03-04 | Discharge: 2017-03-04 | Disposition: A | Discharge status: Other Acute Inpatient Hospital | Payer: Medicare Other | Attending: Emergency Medicine | Admitting: Emergency Medicine

## 2017-03-04 ENCOUNTER — Encounter (HOSPITAL_COMMUNITY): Payer: Self-pay

## 2017-03-04 ENCOUNTER — Inpatient Hospital Stay (HOSPITAL_COMMUNITY)
Admission: AD | Admit: 2017-03-04 | Discharge: 2017-03-07 | DRG: 885 | Disposition: A | Discharge status: Home / Self Care | Payer: Medicare Other | Source: Intra-hospital | Attending: Psychiatry | Admitting: Psychiatry

## 2017-03-04 ENCOUNTER — Emergency Department (HOSPITAL_COMMUNITY): Payer: Medicare Other

## 2017-03-04 DIAGNOSIS — F10929 Alcohol use, unspecified with intoxication, unspecified: Secondary | ICD-10-CM | POA: Diagnosis present

## 2017-03-04 DIAGNOSIS — F10129 Alcohol abuse with intoxication, unspecified: Secondary | ICD-10-CM | POA: Diagnosis present

## 2017-03-04 DIAGNOSIS — Z89512 Acquired absence of left leg below knee: Secondary | ICD-10-CM | POA: Diagnosis not present

## 2017-03-04 DIAGNOSIS — F3289 Other specified depressive episodes: Secondary | ICD-10-CM | POA: Diagnosis not present

## 2017-03-04 DIAGNOSIS — Z79899 Other long term (current) drug therapy: Secondary | ICD-10-CM | POA: Diagnosis not present

## 2017-03-04 DIAGNOSIS — J45909 Unspecified asthma, uncomplicated: Secondary | ICD-10-CM | POA: Diagnosis present

## 2017-03-04 DIAGNOSIS — R945 Abnormal results of liver function studies: Secondary | ICD-10-CM | POA: Diagnosis present

## 2017-03-04 DIAGNOSIS — F1210 Cannabis abuse, uncomplicated: Secondary | ICD-10-CM | POA: Diagnosis not present

## 2017-03-04 DIAGNOSIS — Z811 Family history of alcohol abuse and dependence: Secondary | ICD-10-CM | POA: Diagnosis not present

## 2017-03-04 DIAGNOSIS — F411 Generalized anxiety disorder: Secondary | ICD-10-CM | POA: Diagnosis present

## 2017-03-04 DIAGNOSIS — R45851 Suicidal ideations: Secondary | ICD-10-CM | POA: Diagnosis present

## 2017-03-04 DIAGNOSIS — F1014 Alcohol abuse with alcohol-induced mood disorder: Secondary | ICD-10-CM | POA: Diagnosis present

## 2017-03-04 DIAGNOSIS — F1024 Alcohol dependence with alcohol-induced mood disorder: Secondary | ICD-10-CM | POA: Diagnosis not present

## 2017-03-04 DIAGNOSIS — F1094 Alcohol use, unspecified with alcohol-induced mood disorder: Secondary | ICD-10-CM | POA: Diagnosis not present

## 2017-03-04 DIAGNOSIS — R05 Cough: Secondary | ICD-10-CM | POA: Diagnosis not present

## 2017-03-04 DIAGNOSIS — G4700 Insomnia, unspecified: Secondary | ICD-10-CM | POA: Diagnosis present

## 2017-03-04 DIAGNOSIS — F29 Unspecified psychosis not due to a substance or known physiological condition: Secondary | ICD-10-CM | POA: Diagnosis not present

## 2017-03-04 DIAGNOSIS — K7010 Alcoholic hepatitis without ascites: Secondary | ICD-10-CM | POA: Diagnosis present

## 2017-03-04 DIAGNOSIS — F17200 Nicotine dependence, unspecified, uncomplicated: Secondary | ICD-10-CM | POA: Diagnosis not present

## 2017-03-04 DIAGNOSIS — Z7951 Long term (current) use of inhaled steroids: Secondary | ICD-10-CM

## 2017-03-04 DIAGNOSIS — Z915 Personal history of self-harm: Secondary | ICD-10-CM | POA: Diagnosis not present

## 2017-03-04 DIAGNOSIS — F17210 Nicotine dependence, cigarettes, uncomplicated: Secondary | ICD-10-CM | POA: Diagnosis present

## 2017-03-04 DIAGNOSIS — F1290 Cannabis use, unspecified, uncomplicated: Secondary | ICD-10-CM | POA: Diagnosis present

## 2017-03-04 DIAGNOSIS — F332 Major depressive disorder, recurrent severe without psychotic features: Secondary | ICD-10-CM | POA: Insufficient documentation

## 2017-03-04 DIAGNOSIS — F1020 Alcohol dependence, uncomplicated: Secondary | ICD-10-CM | POA: Clinically undetermined

## 2017-03-04 DIAGNOSIS — I10 Essential (primary) hypertension: Secondary | ICD-10-CM | POA: Diagnosis present

## 2017-03-04 DIAGNOSIS — Y908 Blood alcohol level of 240 mg/100 ml or more: Secondary | ICD-10-CM | POA: Diagnosis present

## 2017-03-04 HISTORY — DX: Unspecified psychosis not due to a substance or known physiological condition: F29

## 2017-03-04 LAB — RAPID URINE DRUG SCREEN, HOSP PERFORMED
Amphetamines: NOT DETECTED
Barbiturates: NOT DETECTED
Benzodiazepines: NOT DETECTED
Cocaine: NOT DETECTED
Opiates: NOT DETECTED
Tetrahydrocannabinol: POSITIVE — AB

## 2017-03-04 LAB — COMPREHENSIVE METABOLIC PANEL
ALT: 100 U/L — ABNORMAL HIGH (ref 17–63)
AST: 123 U/L — ABNORMAL HIGH (ref 15–41)
Albumin: 4 g/dL (ref 3.5–5.0)
Alkaline Phosphatase: 108 U/L (ref 38–126)
Anion gap: 15 (ref 5–15)
BUN: 5 mg/dL — ABNORMAL LOW (ref 6–20)
CO2: 21 mmol/L — ABNORMAL LOW (ref 22–32)
Calcium: 8.5 mg/dL — ABNORMAL LOW (ref 8.9–10.3)
Chloride: 100 mmol/L — ABNORMAL LOW (ref 101–111)
Creatinine, Ser: 0.56 mg/dL — ABNORMAL LOW (ref 0.61–1.24)
GFR calc Af Amer: >60 mL/min (ref >60)
GFR calc non Af Amer: >60 mL/min (ref >60)
Glucose, Bld: 92 mg/dL (ref 65–99)
Potassium: 3.8 mmol/L (ref 3.5–5.1)
Sodium: 136 mmol/L (ref 135–145)
Total Bilirubin: 0.8 mg/dL (ref 0.3–1.2)
Total Protein: 8.2 g/dL — ABNORMAL HIGH (ref 6.5–8.1)

## 2017-03-04 LAB — CBC
HCT: 50.4 % (ref 39.0–52.0)
Hemoglobin: 17.7 g/dL — ABNORMAL HIGH (ref 13.0–17.0)
MCH: 34.8 pg — ABNORMAL HIGH (ref 26.0–34.0)
MCHC: 35.1 g/dL (ref 30.0–36.0)
MCV: 99 fL (ref 78.0–100.0)
Platelets: 158 10*3/uL (ref 150–400)
RBC: 5.09 MIL/uL (ref 4.22–5.81)
RDW: 12.6 % (ref 11.5–15.5)
WBC: 8.3 10*3/uL (ref 4.0–10.5)

## 2017-03-04 LAB — SALICYLATE LEVEL: Salicylate Lvl: <7 mg/dL (ref 2.8–30.0)

## 2017-03-04 LAB — ETHANOL
Alcohol, Ethyl (B): 312 mg/dL (ref <5)
Alcohol, Ethyl (B): 234 mg/dL — ABNORMAL HIGH (ref <5)

## 2017-03-04 LAB — ACETAMINOPHEN LEVEL: Acetaminophen (Tylenol), Serum: <10 ug/mL — ABNORMAL LOW (ref 10–30)

## 2017-03-04 MED ORDER — IPRATROPIUM-ALBUTEROL 0.5-2.5 (3) MG/3ML IN SOLN
3.0000 mL | Freq: Once | RESPIRATORY_TRACT | Status: AC
  Administered 2017-03-04: 3 mL via RESPIRATORY_TRACT
  Filled 2017-03-04: qty 3

## 2017-03-04 MED ORDER — ALBUTEROL SULFATE (2.5 MG/3ML) 0.083% IN NEBU
2.5000 mg | INHALATION_SOLUTION | Freq: Once | RESPIRATORY_TRACT | Status: AC
  Administered 2017-03-04: 2.5 mg via RESPIRATORY_TRACT
  Filled 2017-03-04: qty 3

## 2017-03-04 MED ORDER — IPRATROPIUM-ALBUTEROL 0.5-2.5 (3) MG/3ML IN SOLN: 3.0000 mL | Freq: Once | RESPIRATORY_TRACT | Status: DC

## 2017-03-04 MED ORDER — NICOTINE 21 MG/24HR TD PT24
21.0000 mg | MEDICATED_PATCH | Freq: Once | TRANSDERMAL | Status: DC
  Administered 2017-03-04: 21 mg via TRANSDERMAL

## 2017-03-04 MED ORDER — NICOTINE 21 MG/24HR TD PT24
MEDICATED_PATCH | TRANSDERMAL | Status: AC
  Filled 2017-03-04: qty 1

## 2017-03-04 NOTE — ED Notes (Signed)
CRITICAL VALUE ALERT  Critical Value:  etoh 312  Date & Time Notied:  03/04/2017 1640  Provider Notified: Dr. Roderic Palau  Orders Received/Actions taken: none

## 2017-03-04 NOTE — ED Notes (Signed)
Supper tray given.

## 2017-03-04 NOTE — ED Notes (Signed)
Pt given meal tray.

## 2017-03-04 NOTE — Progress Notes (Signed)
Pt accepted to [facility], Bed 301-2.  Hughie Closs, NP accepting, Dr. Parke Poisson, attending.  Call report to [phone].  Pt may come at any time.  APED Nurse, Alyse Low, notified.  Areatha Keas. Judi Cong, MSW, [place] Work Disposition [phone]

## 2017-03-04 NOTE — ED Notes (Signed)
Staff at [facility] states etoh level has to be under 250 for pt to come.  Pt will not loose his bed.

## 2017-03-04 NOTE — ED Triage Notes (Signed)
Pt brought in by [facility] due to making statements in a store regarding killing himself. People at store called EMS. Pt reports he got into an argument with a drs office regarding treatment for Hep C. He was discharged from drs office and attempted to call office and apologize and again was informed they would no longer see him. And then he went to store and made statements about SI. Pt reports he drinks 2 40 oz daily. Pt states to this nurse that he was going to shot himself and that a couple others out as well. Pt became upset and as I went to call security pt jumped out of bed and went out of fire doors. Security went outside and brought pt back in. Pt tearful and denying what he said. Statements heard by 2 nurses

## 2017-03-04 NOTE — BH Assessment (Addendum)
Tele Assessment Note   Jeff Stevenson is an 56 y.o.single male, brought into [facility] by [facility] on 03/04/2017. Patient stated that he was brought into ED because "I am retarded."  Patient reported being concerned about his health due to being notified about diagnosis of the liver from his gastroenterologist.  Patient reported making statements pertaining to suicidal and homicidal ideations due to his use of alcohol prior to admission to the hospital.  Patient reported use of alcohol (4-40oz. Beers/daily) and a "quarter bag" use of Cannabis, ongoing.  Patient stated that he previously used "Acid," 10-15 years ago, however stated that "If I could find it, I would do it."  Patient reported consumption of alcohol due to feeling that he was unable to sleep.  Patient reported not being able to sleep, causing him to drink until he passed out.  Patient stated that he would had intentions to continue to drink until he received his medications for his gastroenterologist.  Patient reported being unable to eat for the last 2 days. Patient denies AVH.  Patient reported experiences with depressive symptoms, such as despondency, insomnia, tearfulness, isolating himself, fatigue, loss of interest in usual activities, and anger.    Per medical records Jeff Rhein, RN, 03/04/2017) Patient brought in by [facility] due to making statements pertaining to wanting to killing himself when he was in a store.   Per medical records (Long, MD, 03/04/2017) Patient became verbally abusive with staff and ran out of the ED. Patient stated that he would "shoot myself and take a few others with me." He then ran out of the room and through a side door out of the ED. Police were dispatched and brought the patient back to the ED.  IVC paperwork to assist police in bringing the patient back with concern that he is a harm to both himself and others.   Patient identified his support system from his Mother.  Patient does not currently receive outpatient services  from any provider.  Patient reported receiving inpatient treatment at [facility], 3x-4x, in the past.  Patient reported having a family history of substance from his Father.  Patient was able to take of his prosthetic leg and lay down in his bed.  Patient reported no use of other device to complete ADLs.  During assessment, Patient was cooperative.  Patient was dressed in scrubs.  Patient was oriented with the place, time, situation, and person.  Patient exhibited freedom of movement, however appeared to be restless.  Patient stated, "I need some help with drinking."  Patient reported wanting to receive outpatient treatment.        Diagnosis: Major Depressive Disorder, recurrent, severe, without psychotic features Cannabis Use Disorder Alcohol Use Disorder  Past Medical History:  Past Medical History:  Diagnosis Date  . ETOH abuse   . Hepatitis C   . Psychosis     Past Surgical History:  Procedure Laterality Date  . BELOW KNEE LEG AMPUTATION    . broke his back    . CHEST TUBE INSERTION    . GASTROSTOMY W/ FEEDING TUBE     and removal  . SPLENECTOMY    . TRACHEOSTOMY     and removal     Family History:  Family History  Problem Relation Age of Onset  . Breast cancer Mother   . Colon cancer Neg Hx     Social History:  reports that he has been smoking.  He has been smoking about 1.00 pack per day. He has  never used smokeless tobacco. He reports that he drinks alcohol. He reports that he uses drugs, including Marijuana.  Additional Social History:  Alcohol / Drug Use Pain Medications: Patient denies Prescriptions: Patient denies Over the Counter: Patient denies History of alcohol / drug use?: Yes Longest period of sobriety (when/how long): Unknown Negative Consequences of Use: Legal, Personal relationships, Work / School Substance #1 Name of Substance 1: Alcohol 1 - Age of First Use: 55 years old 1 - Amount (size/oz): 103-40z. Beers 1 - Frequency: Daily 1 -  Duration: Ongoing 1 - Last Use / Amount: 03/04/2017 Substance #2 Name of Substance 2: Cannabis 2 - Age of First Use: 56 years old 2 - Amount (size/oz): "Quarter Bag" a month 2 - Frequency: Unknown 2 - Duration: Ongoing 2 - Last Use / Amount: 03/04/2017  CIWA: CIWA-Ar BP: (!) 160/90 Pulse Rate: (!) 114 COWS:    PATIENT STRENGTHS: (choose at least two) Ability for insight Active sense of humor Average or above average intelligence Communication skills Motivation for treatment/growth Supportive family/friends  Allergies: No Known Allergies  Home Medications:  (Not in a hospital admission)  OB/GYN Status:  No LMP for male patient.  General Assessment Data Location of Assessment: [facility] TTS Assessment: In system Is this a Tele or Face-to-Face Assessment?: Tele Assessment Is this an Initial Assessment or a Re-assessment for this encounter?: Initial Assessment Marital status: Single Maiden name: N/A Is patient pregnant?: No Pregnancy Status: No Living Arrangements: Other (Comment) (Pt. reports that his Mother resides with him) Can pt return to current living arrangement?: Yes Admission Status: Involuntary (By Laverta Baltimore, MD ([facility])) Is patient capable of signing voluntary admission?: Yes Referral Source: MD Insurance type: Medicare  Medical Screening Exam ([place]) Medical Exam completed: Yes  Crisis Care Plan Living Arrangements: Other (Comment) (Pt. reports that his Mother resides with him) Legal Guardian: Other: (Self) Name of Psychiatrist: None Name of Therapist: None  Education Status Is patient currently in school?: No Current Grade: N/A Highest grade of school patient has completed: N/A Name of school: N/A Contact person: N/A  Risk to self with the past 6 months Suicidal Ideation: Yes-Currently Present Has patient been a risk to self within the past 6 months prior to admission? : Yes Suicidal Intent: Yes-Currently Present Has patient had any suicidal  intent within the past 6 months prior to admission? : Yes Is patient at risk for suicide?: Yes Suicidal Plan?: Yes-Currently Present Has patient had any suicidal plan within the past 6 months prior to admission? : Yes Specify Current Suicidal Plan: Patient reported wanting to shoot himself.   Access to Means: No What has been your use of drugs/alcohol within the last 12 months?: Alcohol and Cannabis Previous Attempts/Gestures: No How many times?: 0 Other Self Harm Risks: 0 Triggers for Past Attempts: Unpredictable, Other (Comment) (Pt. reported having concerns with his health.) Intentional Self Injurious Behavior: None Family Suicide History: No Recent stressful life event(s): Other (Comment) (Pt. reported concerns with decreasing in health) Persecutory voices/beliefs?: No Depression: Yes Depression Symptoms: Insomnia, Tearfulness, Isolating, Fatigue, Loss of interest in usual pleasures, Feeling angry/irritable, Despondent Substance abuse history and/or treatment for substance abuse?: Yes Suicide prevention information given to non-admitted patients: Not applicable  Risk to Others within the past 6 months Homicidal Ideation: Yes-Currently Present Does patient have any lifetime risk of violence toward others beyond the six months prior to admission? : Unknown Thoughts of Harm to Others: Yes-Currently Present Comment - Thoughts of Harm to Others: Per Long,  MD (03/04/2017) was verbally aggressive to stay and ran out of ED, stating "shoot myself and take a few others with me."  Current Homicidal Intent: Yes-Currently Present Current Homicidal Plan: Yes-Currently Present Describe Current Homicidal Plan: To shoot others Access to Homicidal Means: No Identified Victim: Hospital staff History of harm to others?: Yes Assessment of Violence: On admission Violent Behavior Description: Patient was verbally aggressive to Gastroenterology staff during a phone encounter on 03/01/2017, resulting in  services being cancelled.  Does patient have access to weapons?: No (Pt. denies) Criminal Charges Pending?: No (Pt. denies) Does patient have a court date: No (Pt. denies) Is patient on probation?: No (Pt. denies)  Psychosis Hallucinations: None noted Delusions: None noted  Mental Status Report Appearance/Hygiene: In scrubs Eye Contact: Fair Motor Activity: Freedom of movement, Restlessness Speech: Incoherent, Loud Level of Consciousness: Restless, Other (Comment) (Responsive) Mood: Anxious, Sad, Despair, Worthless, low self-esteem, Helpless, Irritable Affect: Anxious, Depressed, Preoccupied Anxiety Level: Moderate Thought Processes: Relevant, Circumstantial, Tangential Judgement: Impaired Orientation: Person, Place, Time, Situation Obsessive Compulsive Thoughts/Behaviors: None  Cognitive Functioning Concentration: Fair Memory: Recent Intact, Remote Intact IQ: Average Insight: Fair Impulse Control: Poor Appetite: Poor Weight Loss: 0 Weight Gain: 0 Sleep: No Change Total Hours of Sleep: 3 Vegetative Symptoms: None  ADLScreening [facility] Assessment Services) Patient's cognitive ability adequate to safely complete daily activities?: Yes Patient able to express need for assistance with ADLs?: Yes Independently performs ADLs?: Yes (appropriate for developmental age)  Prior Inpatient Therapy Prior Inpatient Therapy: Yes Prior Therapy Dates: Pt. reports past inpatient treatment at [facility] (3x-4x)  Prior Therapy Facilty/Provider(s): [facility] Reason for Treatment: Depression  Prior Outpatient Therapy Prior Outpatient Therapy: No Prior Therapy Dates: N/A Prior Therapy Facilty/Provider(s): N/A Reason for Treatment: N/A Does patient have an ACCT team?: No Does patient have Intensive In-House Services?  : No Does patient have [facility] services? : No Does patient have P4CC services?: No  ADL Screening (condition at time of admission) Patient's cognitive  ability adequate to safely complete daily activities?: Yes Is the patient deaf or have difficulty hearing?: No Does the patient have difficulty seeing, even when wearing glasses/contacts?: No Does the patient have difficulty concentrating, remembering, or making decisions?: No Patient able to express need for assistance with ADLs?: Yes Does the patient have difficulty dressing or bathing?: No Independently performs ADLs?: Yes (appropriate for developmental age) Does the patient have difficulty walking or climbing stairs?: Yes Weakness of Legs: Left Weakness of Arms/Hands: None  Home Assistive Devices/Equipment Home Assistive Devices/Equipment: Prosthesis (Left Leg)    Abuse/Neglect Assessment (Assessment to be complete while patient is alone) Physical Abuse: Denies Verbal Abuse: Denies Sexual Abuse: Denies Exploitation of patient/patient's resources: Denies Self-Neglect: Denies     Regulatory affairs officer (For Healthcare) Does Patient Have a Medical Advance Directive?: No Would patient like information on creating a medical advance directive?: No - Patient declined    Additional Information 1:1 In Past 12 Months?: No CIRT Risk: Yes Elopement Risk: Yes Does patient have medical clearance?: Yes     Disposition:  Disposition Initial Assessment Completed for this Encounter: Yes Disposition of Patient: Inpatient treatment program (Per Otila Kluver, NP) Type of inpatient treatment program: Adult   [place], MD notified of disposition at [place] 03/04/2017 4:51 PM

## 2017-03-04 NOTE — ED Provider Notes (Signed)
[place] DEPT Provider Note   CSN: [id number] Arrival date & time: 03/04/17  1503     History   Chief Complaint Chief Complaint  Patient presents with  . Suicidal    HPI Jeff Stevenson is a 56 y.o. male.  Patient states that he wants to kill himself and other people. He states he will take a gun into   The history is provided by the patient. No language interpreter was used.  Altered Mental Status   This is a new problem. The current episode started 12 to 24 hours ago. The problem has not changed since onset.Pertinent negatives include no somnolence, no seizures and no hallucinations. Risk factors include alcohol intake. His past medical history does not include seizures.    Past Medical History:  Diagnosis Date  . ETOH abuse   . Hepatitis C   . Psychosis     Patient Active Problem List   Diagnosis Date Noted  . Encounter for screening colonoscopy 01/31/2017  . Hepatitis C, chronic ([place]) 11/10/2016  . Elevated LFTs 10/05/2016  . NICOTINE ADDICTION 04/19/2008  . DEPRESSION 04/19/2008  . HYPERTENSION 04/19/2008    Past Surgical History:  Procedure Laterality Date  . BELOW KNEE LEG AMPUTATION    . broke his back    . CHEST TUBE INSERTION    . GASTROSTOMY W/ FEEDING TUBE     and removal  . SPLENECTOMY    . TRACHEOSTOMY     and removal        Home Medications    Prior to Admission medications   Medication Sig Start Date End Date Taking? Authorizing Provider  lisinopril-hydrochlorothiazide (PRINZIDE,ZESTORETIC) 10-12.5 MG tablet Take 1 tablet by mouth daily.    [provider]  Na Sulfate-K Sulfate-Mg Sulf (SUPREP BOWEL PREP KIT) 17.5-3.13-1.6 GM/180ML SOLN Take 1 kit by mouth as directed. 01/31/17   Rourk, Cristopher Estimable, MD  SYMBICORT 160-4.5 MCG/ACT inhaler Inhale 2 puffs into the lungs 2 (two) times daily. 09/03/16   [provider]  traZODone (DESYREL) 50 MG tablet Take 50 mg by mouth at bedtime as needed (for sleep.).  12/31/16    [provider]    Family History Family History  Problem Relation Age of Onset  . Breast cancer Mother   . Colon cancer Neg Hx     Social History Social History  Substance Use Topics  . Smoking status: Current Every Day Smoker    Packs/day: 1.00  . Smokeless tobacco: Never Used  . Alcohol use Yes     Comment: 4- 40 OZ DAILY     Allergies   Patient has no known allergies.   Review of Systems Review of Systems  Constitutional: Negative for appetite change and fatigue.  HENT: Negative for congestion, ear discharge and sinus pressure.   Eyes: Negative for discharge.  Respiratory: Negative for cough.   Cardiovascular: Negative for chest pain.  Gastrointestinal: Negative for abdominal pain and diarrhea.  Genitourinary: Negative for frequency and hematuria.  Musculoskeletal: Negative for back pain.  Skin: Negative for rash.  Neurological: Negative for seizures and headaches.  Psychiatric/Behavioral: Positive for dysphoric mood. Negative for hallucinations.     Physical Exam Updated Vital Signs BP (!) 160/90   Pulse (!) 114   Temp 98.6 F (37 C) (Oral)   Resp 20   Ht 5' 11"  (1.803 m)   Wt 74.8 kg (165 lb)   SpO2 96%   BMI 23.01 kg/m   Physical Exam  Constitutional: He is oriented to  person, place, and time. He appears well-developed.  HENT:  Head: Normocephalic.  Eyes: Conjunctivae and EOM are normal. No scleral icterus.  Neck: Neck supple. No thyromegaly present.  Cardiovascular: Normal rate and regular rhythm.  Exam reveals no gallop and no friction rub.   No murmur heard. Pulmonary/Chest: No stridor. He has no wheezes. He has no rales. He exhibits no tenderness.  Abdominal: He exhibits no distension. There is no tenderness. There is no rebound.  Musculoskeletal: Normal range of motion. He exhibits no edema.  Lymphadenopathy:    He has no cervical adenopathy.  Neurological: He is oriented to person, place, and time. He exhibits normal muscle  tone. Coordination normal.  Skin: No rash noted. No erythema.  Psychiatric:  Pt suicidal and homicidal     ED Treatments / Results  Labs (all labs ordered are listed, but only abnormal results are displayed) Labs Reviewed  COMPREHENSIVE METABOLIC PANEL - Abnormal; Notable for the following:       Result Value   Chloride 100 (*)    CO2 21 (*)    BUN 5 (*)    Creatinine, Ser 0.56 (*)    Calcium 8.5 (*)    Total Protein 8.2 (*)    AST 123 (*)    ALT 100 (*)    All other components within normal limits  ETHANOL - Abnormal; Notable for the following:    Alcohol, Ethyl (B) 312 (*)    All other components within normal limits  ACETAMINOPHEN LEVEL - Abnormal; Notable for the following:    Acetaminophen (Tylenol), Serum <10 (*)    All other components within normal limits  CBC - Abnormal; Notable for the following:    Hemoglobin 17.7 (*)    MCH 34.8 (*)    All other components within normal limits  SALICYLATE LEVEL  RAPID URINE DRUG SCREEN, HOSP PERFORMED    EKG  EKG Interpretation None       Radiology Dg Chest 2 View  Result Date: 03/04/2017 CLINICAL DATA:  Cough and congestion.  Smoker. EXAM: CHEST  2 VIEW COMPARISON:  CT chest 10/10/2013 and PA and lateral chest 10/02/2013. FINDINGS: Eventration of the right hemidiaphragm with a focal opacity in the right lower lobe representing the liver dome as seen on prior CT is unchanged. The lungs are emphysematous but clear. No pneumothorax or pleural effusion. No focal bony abnormality. IMPRESSION: No acute disease. Emphysema. Eventration of the right hemidiaphragm with protrusion of the dome of the liver into the chest is unchanged. Electronically Signed   By: Inge Rise M.D.   On: 03/04/2017 16:32    Procedures Procedures (including critical care time)  Medications Ordered in ED Medications  ipratropium-albuterol (DUONEB) 0.5-2.5 (3) MG/3ML nebulizer solution 3 mL (3 mLs Nebulization Given 03/04/17 1623)  albuterol  (PROVENTIL) (2.5 MG/3ML) 0.083% nebulizer solution 2.5 mg (2.5 mg Nebulization Given 03/04/17 1623)     Initial Impression / Assessment and Plan / ED Course  I have reviewed the triage vital signs and the nursing notes.  Pertinent labs & imaging results that were available during my care of the patient were reviewed by me and considered in my medical decision making (see chart for details).     Pt suicidal.  Will be admitted to behavior health  Final Clinical Impressions(s) / ED Diagnoses   Final diagnoses:  Suicidal ideation    New Prescriptions New Prescriptions   No medications on file     Milton Ferguson, MD 03/04/17 1754

## 2017-03-04 NOTE — Progress Notes (Signed)
Patient ID: Jeff Stevenson, male   DOB: [birth year], 56 y.o.   MRN: [id number] Per State regulations 482.30 this chart was reviewed for medical necessity with respect to the patient's admission/duration of stay.    Next review date: 03/08/17  Debarah Crape, BSN, RN-BC  Case Manager

## 2017-03-04 NOTE — ED Notes (Addendum)
Observed pt running out of room and walked out back emergency exit.  Security and pd called.  EDP notified

## 2017-03-04 NOTE — ED Notes (Signed)
In room during triage,  Pt states he wants to shoot himself and shoot up a couple of other people.  Then pt starts laughing saying that ain't funny is it.

## 2017-03-04 NOTE — ED Provider Notes (Signed)
I was in an adjacent room when the patient suddenly became verbally abusive with staff and ran out of the ED. He is yelling that he will "shoot myself and take a few others with me." He then ran out of the room and through a side door out of the ED. Police were dispatched and brought the patient back to the ED. I filed IVC paperwork to assist police in bringing the patient back with concern that he is a harm to both himself and others.   Nanda Quinton, MD Emergency Medicine    Long, Wonda Olds, MD 03/04/17 [phone]

## 2017-03-05 ENCOUNTER — Encounter (HOSPITAL_COMMUNITY): Payer: Self-pay | Admitting: *Deleted

## 2017-03-05 DIAGNOSIS — F1210 Cannabis abuse, uncomplicated: Secondary | ICD-10-CM

## 2017-03-05 DIAGNOSIS — R45851 Suicidal ideations: Secondary | ICD-10-CM

## 2017-03-05 DIAGNOSIS — F1094 Alcohol use, unspecified with alcohol-induced mood disorder: Secondary | ICD-10-CM

## 2017-03-05 DIAGNOSIS — F10929 Alcohol use, unspecified with intoxication, unspecified: Secondary | ICD-10-CM

## 2017-03-05 DIAGNOSIS — F332 Major depressive disorder, recurrent severe without psychotic features: Secondary | ICD-10-CM | POA: Diagnosis present

## 2017-03-05 MED ORDER — ACETAMINOPHEN 325 MG PO TABS
650.0000 mg | ORAL_TABLET | Freq: Four times a day (QID) | ORAL | Status: DC | PRN
Start: 2017-03-05 — End: 2017-03-07

## 2017-03-05 MED ORDER — LORAZEPAM 1 MG PO TABS
1.0000 mg | ORAL_TABLET | Freq: Four times a day (QID) | ORAL | Status: DC | PRN
  Administered 2017-03-06 (×2): 1 mg via ORAL
  Filled 2017-03-05 (×2): qty 1

## 2017-03-05 MED ORDER — ADULT MULTIVITAMIN W/MINERALS CH
1.0000 | ORAL_TABLET | Freq: Every day | ORAL | Status: DC
  Administered 2017-03-05 – 2017-03-07 (×3): 1 via ORAL
  Filled 2017-03-05 (×6): qty 1

## 2017-03-05 MED ORDER — LORAZEPAM 1 MG PO TABS: 1.0000 mg | ORAL_TABLET | Freq: Two times a day (BID) | ORAL | Status: DC

## 2017-03-05 MED ORDER — LORAZEPAM 1 MG PO TABS
1.0000 mg | ORAL_TABLET | Freq: Four times a day (QID) | ORAL | Status: AC
  Administered 2017-03-05 – 2017-03-06 (×4): 1 mg via ORAL
  Filled 2017-03-05 (×4): qty 1

## 2017-03-05 MED ORDER — ALUM & MAG HYDROXIDE-SIMETH 200-200-20 MG/5ML PO SUSP: 30.0000 mL | ORAL | Status: DC | PRN

## 2017-03-05 MED ORDER — HYDROCHLOROTHIAZIDE 12.5 MG PO CAPS
12.5000 mg | ORAL_CAPSULE | Freq: Every day | ORAL | Status: DC
  Administered 2017-03-05 – 2017-03-07 (×3): 12.5 mg via ORAL
  Filled 2017-03-05 (×7): qty 1

## 2017-03-05 MED ORDER — LISINOPRIL 10 MG PO TABS
10.0000 mg | ORAL_TABLET | Freq: Every day | ORAL | Status: DC
  Administered 2017-03-05 – 2017-03-07 (×3): 10 mg via ORAL
  Filled 2017-03-05 (×5): qty 1

## 2017-03-05 MED ORDER — ONDANSETRON 4 MG PO TBDP
4.0000 mg | ORAL_TABLET | Freq: Four times a day (QID) | ORAL | Status: DC | PRN
  Administered 2017-03-06: 4 mg via ORAL
  Filled 2017-03-05: qty 1

## 2017-03-05 MED ORDER — HYDROXYZINE HCL 25 MG PO TABS
25.0000 mg | ORAL_TABLET | Freq: Four times a day (QID) | ORAL | Status: DC | PRN
  Administered 2017-03-05 – 2017-03-06 (×2): 25 mg via ORAL
  Filled 2017-03-05 (×2): qty 1

## 2017-03-05 MED ORDER — LORAZEPAM 1 MG PO TABS: 1.0000 mg | ORAL_TABLET | Freq: Every day | ORAL | Status: DC

## 2017-03-05 MED ORDER — NICOTINE 21 MG/24HR TD PT24
21.0000 mg | MEDICATED_PATCH | Freq: Once | TRANSDERMAL | Status: DC
  Filled 2017-03-05: qty 1

## 2017-03-05 MED ORDER — LOPERAMIDE HCL 2 MG PO CAPS
2.0000 mg | ORAL_CAPSULE | ORAL | Status: DC | PRN
Start: 2017-03-05 — End: 2017-03-07

## 2017-03-05 MED ORDER — TRAZODONE HCL 50 MG PO TABS
50.0000 mg | ORAL_TABLET | Freq: Every evening | ORAL | Status: DC | PRN
  Administered 2017-03-05 – 2017-03-06 (×3): 50 mg via ORAL
  Filled 2017-03-05 (×3): qty 1

## 2017-03-05 MED ORDER — THIAMINE HCL 100 MG/ML IJ SOLN
100.0000 mg | Freq: Once | INTRAMUSCULAR | Status: AC
  Administered 2017-03-05: 100 mg via INTRAMUSCULAR
  Filled 2017-03-05: qty 2

## 2017-03-05 MED ORDER — HYDROXYZINE HCL 25 MG PO TABS
25.0000 mg | ORAL_TABLET | Freq: Three times a day (TID) | ORAL | Status: DC | PRN
  Administered 2017-03-05: 25 mg via ORAL
  Filled 2017-03-05: qty 1

## 2017-03-05 MED ORDER — VITAMIN B-1 100 MG PO TABS
100.0000 mg | ORAL_TABLET | Freq: Every day | ORAL | Status: DC
  Administered 2017-03-06 – 2017-03-07 (×2): 100 mg via ORAL
  Filled 2017-03-05 (×4): qty 1

## 2017-03-05 MED ORDER — MAGNESIUM HYDROXIDE 400 MG/5ML PO SUSP: 30.0000 mL | Freq: Every day | ORAL | Status: DC | PRN

## 2017-03-05 MED ORDER — LISINOPRIL-HYDROCHLOROTHIAZIDE 10-12.5 MG PO TABS: 1.0000 | ORAL_TABLET | Freq: Every day | ORAL | Status: DC

## 2017-03-05 MED ORDER — LORAZEPAM 1 MG PO TABS
1.0000 mg | ORAL_TABLET | Freq: Three times a day (TID) | ORAL | Status: AC
  Administered 2017-03-06 – 2017-03-07 (×3): 1 mg via ORAL
  Filled 2017-03-05 (×3): qty 1
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
## 2017-03-05 NOTE — Tx Team (Signed)
Initial Treatment Plan 03/05/2017 1:58 AM [name] CWU:[id number]    PATIENT STRESSORS: Substance abuse   PATIENT STRENGTHS: Ability for insight Communication skills Motivation for treatment/growth   PATIENT IDENTIFIED PROBLEMS: At risk for suicide  Substance Abuse  "Stop drinking"  "Identify triggers for drinking"               DISCHARGE CRITERIA:  Ability to meet basic life and health needs Improved stabilization in mood, thinking, and/or behavior Motivation to continue treatment in a less acute level of care Need for constant or close observation no longer present Verbal commitment to aftercare and medication compliance Withdrawal symptoms are absent or subacute and managed without 24-hour nursing intervention  PRELIMINARY DISCHARGE PLAN: Attend 12-step recovery group Outpatient therapy Return to previous living arrangement  PATIENT/FAMILY INVOLVEMENT: This treatment plan has been presented to and reviewed with the patient, Jeff Stevenson.  The patient and family have been given the opportunity to ask questions and make suggestions.  Dustin Flock, RN 03/05/2017, 1:58 AM

## 2017-03-05 NOTE — Progress Notes (Signed)
Admission Note:  56 year old male who presents IVC, in no acute distress, for the treatment of SI and Substance Abuse. Patient appears anxious.  Patient has complaints of "nausea, shaking, and anxiety".  Patient was cooperative with admission process. Patient denies SI and contracts for safety upon admission. Patient denies AVH.  Patient states "I'm just burned out". Patient is unable to identify any specific stressors.  Patient reports that he drinks "3-4 40oz a day".  Patient reports marijuana use.  Patient's mother lives with him. Patient is unable to identify a support system.  Patient is on disability. Patient reports that he is unable to read. While at [facility], patient would like to "stop drinking" and "identify triggers for drinking".  Skin was assessed.  Patient has cat scratches to arms/hands bilateral, abrasion to right knee, and abrasion to lower leg. Patient has old scar on bottom of right foot and left leg/foot prosthesis.  Patient searched and no contraband found, POC and unit policies explained and understanding verbalized. Consents obtained. Food and fluids offered and accepted. Patient had no additional questions or concerns.

## 2017-03-05 NOTE — BHH Group Notes (Signed)
Psychoeducational Group Note  Date:  03/05/2017 Time:  1330  Group Topic/Focus:  Building Self Esteem:   The Focus of this group is helping patients become aware of the effects of self-esteem on their lives, the things they and others do that enhance or undermine their self-esteem, seeing the relationship between their level of self-esteem and the choices they make and learning ways to enhance self-esteem.  Participation Level: Moderate  Participation Quality: Good  Affect: Appropriate to mood  Cognitive:  WNL  Insight:  Good  Engagement in Group: Good  Additional Comments: Pt responded well to a self-esteem building exercise which relied on peer feedback.

## 2017-03-05 NOTE — Progress Notes (Signed)
Patient did not attend the evening speaker AA meeting. Pt was notified that group was beginning but remained in bed.   

## 2017-03-05 NOTE — Progress Notes (Signed)
NSG 7a-7p shift:   D:  Pt. Has been very pleasant this shift, reporting that he had made statements of a suicidal nature due to being intoxicated.  He has attended groups with minimal to moderate participation and has interacted appropriately in the milieu.  Pt's Goal today is to identify 15 positive traits that are applicable to him.  A: Support, education, and encouragement provided as needed.  Level 3 checks continued for safety.  R: Pt.  receptive to intervention/s.  Safety maintained.  Prudencio Pair, RN

## 2017-03-05 NOTE — BHH Suicide Risk Assessment (Signed)
[facility] Admission Suicide Risk Assessment   Nursing information obtained from:  Patient Demographic factors:  Male, Divorced or widowed, Caucasian, Living alone, Unemployed Current Mental Status:  NA Loss Factors:  NA Historical Factors:  NA Risk Reduction Factors:  Positive social support  Total Time spent with patient: 45 minutes Principal Problem: <principal problem not specified> Diagnosis:   Patient Active Problem List   Diagnosis Date Noted  . MDD (major depressive disorder), recurrent severe, without psychosis ([place]) [F33.2] 03/05/2017  . Encounter for screening colonoscopy [Z12.11] 01/31/2017  . Hepatitis C, chronic ([place]) [B18.2] 11/10/2016  . Elevated LFTs [R79.89] 10/05/2016  . NICOTINE ADDICTION [F17.200] 04/19/2008  . DEPRESSION [F32.9] 04/19/2008  . HYPERTENSION [I10] 04/19/2008   Subjective Data: Patient admitted for drinking alcohol x 35 years and smoking marijuana and lives with his mother. Patient presented with alcohol intoxication, depression and suicide ideations. Patient was recently diagnosed with hepatitis but not received medication therapy and PCP told him he needs to be detox treatment. Patient denied current suicide ideation, intention or plans. He was [name] about 30 years ago for suicide attempt and antisocial traits while using acid.   Continued Clinical Symptoms:  Alcohol Use Disorder Identification Test Final Score (AUDIT): 33 The "Alcohol Use Disorders Identification Test", Guidelines for Use in Primary Care, Second Edition.  World Pharmacologist [facility]). Score between 0-7:  no or low risk or alcohol related problems. Score between 8-15:  moderate risk of alcohol related problems. Score between 16-19:  high risk of alcohol related problems. Score 20 or above:  warrants further diagnostic evaluation for alcohol dependence and treatment.   CLINICAL FACTORS:   Severe Anxiety and/or Agitation Depression:   Comorbid alcohol  abuse/dependence Impulsivity Insomnia Recent sense of peace/wellbeing Alcohol/Substance Abuse/Dependencies Previous Psychiatric Diagnoses and Treatments Medical Diagnoses and Treatments/Surgeries    Psychiatric Specialty Exam: Physical Exam  ROS  Blood pressure (!) 144/113, pulse (!) 109, temperature 98.5 F (36.9 C), temperature source Oral, resp. rate 16, height 5\' 11"  (1.803 m), weight 74.8 kg (165 lb).Body mass index is 23.01 kg/m.     COGNITIVE FEATURES THAT CONTRIBUTE TO RISK:  Closed-mindedness, Loss of executive function and Polarized thinking    SUICIDE RISK:   Mild:  Suicidal ideation of limited frequency, intensity, duration, and specificity.  There are no identifiable plans, no associated intent, mild dysphoria and related symptoms, good self-control (both objective and subjective assessment), few other risk factors, and identifiable protective factors, including available and accessible social support.  PLAN OF CARE: Admit for alcohol detox treatment and increased symptoms of depression and suicide ideation. He needs inpatient detox treatment, possible rehab and monitor for safety monitoring and crisis evaluation.   I certify that inpatient services furnished can reasonably be expected to improve the patient's condition.   Ambrose Finland, MD 03/05/2017, 10:44 AM

## 2017-03-05 NOTE — H&P (Signed)
Psychiatric Admission Assessment Adult  Patient Identification: Jeff Stevenson MRN:  [id number] Date of Evaluation:  03/05/2017 Chief Complaint:  MDD Recurrent Severe Without Psychotic Features Alcohol use Disorder Cannabis use Disorder Principal Diagnosis: Onset of alcohol-induced mood disorder during intoxication [facility]) Diagnosis:   Patient Active Problem List   Diagnosis Date Noted  . MDD (major depressive disorder), recurrent severe, without psychosis ([place]) [F33.2] 03/05/2017  . Encounter for screening colonoscopy [Z12.11] 01/31/2017  . Hepatitis C, chronic ([place]) [B18.2] 11/10/2016  . Elevated LFTs [R79.89] 10/05/2016  . NICOTINE ADDICTION [F17.200] 04/19/2008  . DEPRESSION [F32.9] 04/19/2008  . HYPERTENSION [I10] 04/19/2008   History of Present Illness: Jeff Stevenson is a 57 years old Caucasian male admitted to behavioral health Hospital emergently and involuntarily(IVC) from [place] emergency department for increased symptoms of depression and suicidal ideation. Patient is also reportedly intoxicated with alcohol. Patient admitted for drinking alcohol x 35 years and smoking marijuana and lives with his mother. Patient was recently diagnosed with alcoholic hepatitis but not received medication therapy and PCP told him he needs to be detox treatment. Patient denied current suicide ideation, intention or plans. [facility] years ago for suicide attempt and antisocial traits while using acid. Patient is willing to participate alcohol detox treatment and needed to be taking trazodone for insomnia but stated he does not need medication for depression because it is due to drinking alcohol. Patient blood alcohol level is 312 on arrival and also elevated liver enzymes with AST and ALT. Patient urine drug screen is positive for tetrahydrocannabinol.  Associated Signs/Symptoms: Depression Symptoms:  depressed mood, anhedonia, insomnia, psychomotor  agitation, fatigue, hopelessness, impaired memory, recurrent thoughts of death, anxiety, disturbed sleep, weight loss, decreased labido, decreased appetite, (Hypo) Manic Symptoms:  Impulsivity, Irritable Mood, Anxiety Symptoms:  Excessive Worry, Psychotic Symptoms:  denied PTSD Symptoms: NA Total Time spent with patient: 1 hour  Past Psychiatric History: Patient has a history of acute psychiatric hospitalization at [facility] about [facility] years ago and also reportedly using acid at that time.  Is the patient at risk to self? Yes.    Has the patient been a risk to self in the past 6 months? Yes.    Has the patient been a risk to self within the distant past? No.  Is the patient a risk to others? Yes.    Has the patient been a risk to others in the past 6 months? No.  Has the patient been a risk to others within the distant past? No.   Prior Inpatient Therapy:   Prior Outpatient Therapy:    Alcohol Screening: 1. How often do you have a drink containing alcohol?: 4 or more times a week 2. How many drinks containing alcohol do you have on a typical day when you are drinking?: 10 or more 3. How often do you have six or more drinks on one occasion?: Daily or almost daily Preliminary Score: 8 4. How often during the last year have you found that you were not able to stop drinking once you had started?: Daily or almost daily 5. How often during the last year have you failed to do what was normally expected from you becasue of drinking?: Never 6. How often during the last year have you needed a first drink in the morning to get yourself going after a heavy drinking session?: Daily or almost daily 7. How often during the last year have you had a feeling of guilt  of remorse after drinking?: Daily or almost daily 8. How often during the last year have you been unable to remember what happened the night before because you had been drinking?: Weekly 9. Have you or someone else been  injured as a result of your drinking?: Yes, but not in the last year 10. Has a relative or friend or a doctor or another health worker been concerned about your drinking or suggested you cut down?: Yes, during the last year Alcohol Use Disorder Identification Test Final Score (AUDIT): 33 Brief Intervention: Yes Substance Abuse History in the last 12 months:  Yes.   Consequences of Substance Abuse: Medical Consequences:  alcoholic hepatitis and elevated liver enzymes Blackouts:  has frequent blackouts and does not remember Withdrawal Symptoms:   Cramps Diaphoresis Headaches Nausea Tremors Vomiting Previous Psychotropic Medications: No  Psychological Evaluations: Yes  Past Medical History:  Past Medical History:  Diagnosis Date  . ETOH abuse   . Hepatitis C   . Psychosis     Past Surgical History:  Procedure Laterality Date  . BELOW KNEE LEG AMPUTATION    . broke his back    . CHEST TUBE INSERTION    . GASTROSTOMY W/ FEEDING TUBE     and removal  . SPLENECTOMY    . TRACHEOSTOMY     and removal    Family History:  Family History  Problem Relation Age of Onset  . Breast cancer Mother   . Colon cancer Neg Hx    Family Psychiatric  History: unknown Tobacco Screening: Have you used any form of tobacco in the last 30 days? (Cigarettes, Smokeless Tobacco, Cigars, and/or Pipes): Yes Tobacco use, Select all that apply: 5 or more cigarettes per day Are you interested in Tobacco Cessation Medications?: Yes, will notify MD for an order Counseled patient on smoking cessation including recognizing danger situations, developing coping skills and basic information about quitting provided: Yes Social History:  History  Alcohol Use  . Yes    Comment: 4- 40 OZ DAILY     History  Drug Use  . Types: Marijuana    Comment: history of intranasal cocaine, no IV drug use, none currently     Additional Social History: Patient has mother, 2 brothers and ex-wife who has been drinking  alcohol. Patient has a 58 years old son who is also drinks alcohol. Patient has 3 dogs, 4 cats and mother at home. Patient has been considered disabled due to medical problems.       Allergies:  No Known Allergies Lab Results:  Results for orders placed or performed during the hospital encounter of 03/04/17 (from the past 48 hour(s))  Comprehensive metabolic panel     Status: Abnormal   Collection Time: 03/04/17  3:41 PM  Result Value Ref Range   Sodium 136 135 - 145 mmol/L   Potassium 3.8 3.5 - 5.1 mmol/L   Chloride 100 (L) 101 - 111 mmol/L   CO2 21 (L) 22 - 32 mmol/L   Glucose, Bld 92 65 - 99 mg/dL   BUN 5 (L) 6 - 20 mg/dL   Creatinine, Ser 0.56 (L) 0.61 - 1.24 mg/dL   Calcium 8.5 (L) 8.9 - 10.3 mg/dL   Total Protein 8.2 (H) 6.5 - 8.1 g/dL   Albumin 4.0 3.5 - 5.0 g/dL   AST 123 (H) 15 - 41 U/L   ALT 100 (H) 17 - 63 U/L   Alkaline Phosphatase 108 38 - 126 U/L   Total Bilirubin 0.8 0.3 -  1.2 mg/dL   GFR calc non Af Amer >60 >60 mL/min   GFR calc Af Amer >60 >60 mL/min    Comment: (NOTE) The eGFR has been calculated using the CKD EPI equation. This calculation has not been validated in all clinical situations. eGFR's persistently <60 mL/min signify possible Chronic Kidney Disease.    Anion gap 15 5 - 15  Ethanol     Status: Abnormal   Collection Time: 03/04/17  3:41 PM  Result Value Ref Range   Alcohol, Ethyl (B) 312 (HH) <5 mg/dL    Comment:        LOWEST DETECTABLE LIMIT FOR SERUM ALCOHOL IS 5 mg/dL FOR MEDICAL PURPOSES ONLY CRITICAL RESULT CALLED TO, READ BACK BY AND VERIFIED WITH: CARDWELL,L ON 03/04/17 AT 3646 BY LOY,C   Salicylate level     Status: None   Collection Time: 03/04/17  3:41 PM  Result Value Ref Range   Salicylate Lvl <8.0 2.8 - 30.0 mg/dL  Acetaminophen level     Status: Abnormal   Collection Time: 03/04/17  3:41 PM  Result Value Ref Range   Acetaminophen (Tylenol), Serum <10 (L) 10 - 30 ug/mL    Comment:        THERAPEUTIC CONCENTRATIONS  VARY SIGNIFICANTLY. A RANGE OF 10-30 ug/mL MAY BE AN EFFECTIVE CONCENTRATION FOR MANY PATIENTS. HOWEVER, SOME ARE BEST TREATED AT CONCENTRATIONS OUTSIDE THIS RANGE. ACETAMINOPHEN CONCENTRATIONS >150 ug/mL AT 4 HOURS AFTER INGESTION AND >50 ug/mL AT 12 HOURS AFTER INGESTION ARE OFTEN ASSOCIATED WITH TOXIC REACTIONS.   cbc     Status: Abnormal   Collection Time: 03/04/17  3:41 PM  Result Value Ref Range   WBC 8.3 4.0 - 10.5 K/uL   RBC 5.09 4.22 - 5.81 MIL/uL   Hemoglobin 17.7 (H) 13.0 - 17.0 g/dL   HCT 50.4 39.0 - 52.0 %   MCV 99.0 78.0 - 100.0 fL   MCH 34.8 (H) 26.0 - 34.0 pg   MCHC 35.1 30.0 - 36.0 g/dL   RDW 12.6 11.5 - 15.5 %   Platelets 158 150 - 400 K/uL  Ethanol     Status: Abnormal   Collection Time: 03/04/17  6:47 PM  Result Value Ref Range   Alcohol, Ethyl (B) 234 (H) <5 mg/dL    Comment:        LOWEST DETECTABLE LIMIT FOR SERUM ALCOHOL IS 5 mg/dL FOR MEDICAL PURPOSES ONLY   Rapid urine drug screen (hospital performed)     Status: Abnormal   Collection Time: 03/04/17  8:20 PM  Result Value Ref Range   Opiates NONE DETECTED NONE DETECTED   Cocaine NONE DETECTED NONE DETECTED   Benzodiazepines NONE DETECTED NONE DETECTED   Amphetamines NONE DETECTED NONE DETECTED   Tetrahydrocannabinol POSITIVE (A) NONE DETECTED   Barbiturates NONE DETECTED NONE DETECTED    Comment:        DRUG SCREEN FOR MEDICAL PURPOSES ONLY.  IF CONFIRMATION IS NEEDED FOR ANY PURPOSE, NOTIFY LAB WITHIN 5 DAYS.        LOWEST DETECTABLE LIMITS FOR URINE DRUG SCREEN Drug Class       Cutoff (ng/mL) Amphetamine      1000 Barbiturate      200 Benzodiazepine   321 Tricyclics       224 Opiates          300 Cocaine          300 THC              50  Blood Alcohol level:  Lab Results  Component Value Date   ETH 234 (H) 03/04/2017   ETH 312 (HH) 96/78/9381    Metabolic Disorder Labs:  No results found for: HGBA1C, MPG No results found for: PROLACTIN No results found for: CHOL,  TRIG, HDL, CHOLHDL, VLDL, LDLCALC  Current Medications: Current Facility-Administered Medications  Medication Dose Route Frequency Provider Last Rate Last Dose  . acetaminophen (TYLENOL) tablet 650 mg  650 mg Oral Q6H PRN Okonkwo, Justina A, NP      . alum & mag hydroxide-simeth (MAALOX/MYLANTA) 200-200-20 MG/5ML suspension 30 mL  30 mL Oral Q4H PRN Okonkwo, Justina A, NP      . lisinopril (PRINIVIL,ZESTRIL) tablet 10 mg  10 mg Oral Daily Cobos, Myer Peer, MD   10 mg at 03/05/17 0843   And  . hydrochlorothiazide (MICROZIDE) capsule 12.5 mg  12.5 mg Oral Daily Cobos, Myer Peer, MD   12.5 mg at 03/05/17 0848  . hydrOXYzine (ATARAX/VISTARIL) tablet 25 mg  25 mg Oral Q6H PRN Ambrose Finland, MD      . loperamide (IMODIUM) capsule 2-4 mg  2-4 mg Oral PRN Ambrose Finland, MD      . LORazepam (ATIVAN) tablet 1 mg  1 mg Oral Q6H PRN Ambrose Finland, MD      . LORazepam (ATIVAN) tablet 1 mg  1 mg Oral QID Ambrose Finland, MD       Followed by  . [START ON 03/06/2017] LORazepam (ATIVAN) tablet 1 mg  1 mg Oral TID Ambrose Finland, MD       Followed by  . [START ON 03/07/2017] LORazepam (ATIVAN) tablet 1 mg  1 mg Oral BID Ambrose Finland, MD       Followed by  . [START ON 03/09/2017] LORazepam (ATIVAN) tablet 1 mg  1 mg Oral Daily Cledith Abdou, Arbutus Ped, MD      . magnesium hydroxide (MILK OF MAGNESIA) suspension 30 mL  30 mL Oral Daily PRN Okonkwo, Justina A, NP      . multivitamin with minerals tablet 1 tablet  1 tablet Oral Daily Cisco Kindt, Arbutus Ped, MD      . nicotine (NICODERM CQ - dosed in mg/24 hours) patch 21 mg  21 mg Transdermal Once Okonkwo, Justina A, NP      . ondansetron (ZOFRAN-ODT) disintegrating tablet 4 mg  4 mg Oral Q6H PRN Ambrose Finland, MD      . thiamine (B-1) injection 100 mg  100 mg Intramuscular Once Ambrose Finland, MD      . Derrill Memo ON 03/06/2017] thiamine (VITAMIN B-1) tablet 100 mg  100 mg Oral  Daily Ambrose Finland, MD      . traZODone (DESYREL) tablet 50 mg  50 mg Oral QHS PRN Okonkwo, Justina A, NP   50 mg at 03/05/17 0120   PTA Medications: Prescriptions Prior to Admission  Medication Sig Dispense Refill Last Dose  . lisinopril-hydrochlorothiazide (PRINZIDE,ZESTORETIC) 10-12.5 MG tablet Take 1 tablet by mouth daily.   Past Week at Unknown time  . Na Sulfate-K Sulfate-Mg Sulf (SUPREP BOWEL PREP KIT) 17.5-3.13-1.6 GM/180ML SOLN Take 1 kit by mouth as directed. 1 Bottle 0   . SYMBICORT 160-4.5 MCG/ACT inhaler Inhale 2 puffs into the lungs 2 (two) times daily.   Past Week at Unknown time  . traZODone (DESYREL) 50 MG tablet Take 50 mg by mouth at bedtime as needed (for sleep.).    Past Month at Unknown time  . VENTOLIN HFA 108 (90 Base) MCG/ACT inhaler    unknown  Musculoskeletal: Strength & Muscle Tone: within normal limits Gait & Station: normal Patient leans: N/A  Psychiatric Specialty Exam: Physical Exam Full physical performed in Emergency Department. I have reviewed this assessment and concur with its findings.   ROS patient endorses drinking heavily for last 35 years and suffering with alcoholic hepatitis and primary care physician recommending to get detox treatment. Patient endorses depression and suicidal ideation while intoxicated but denied during this visit. No Fever-chills, No Headache, No changes with Vision or hearing, reports vertigo No problems swallowing food or Liquids, No Chest pain, Cough or Shortness of Breath, No Abdominal pain, No Nausea or Vommitting, Bowel movements are regular, No Blood in stool or Urine, No dysuria, No new skin rashes or bruises, No new joints pains-aches,  No new weakness, tingling, numbness in any extremity, No recent weight gain or loss, No polyuria, polydypsia or polyphagia,  A full 10 point Review of Systems was done, except as stated above, all other Review of Systems were negative.  Blood pressure (!)  144/113, pulse (!) 109, temperature 98.5 F (36.9 C), temperature source Oral, resp. rate 16, height 5' 11"  (1.803 m), weight 74.8 kg (165 lb).Body mass index is 23.01 kg/m.  General Appearance: Disheveled  Eye Contact:  Good  Speech:  Clear and Coherent  Volume:  Decreased  Mood:  Anxious and Depressed  Affect:  Constricted and Depressed  Thought Process:  Coherent and Goal Directed  Orientation:  Full (Time, Place, and Person)  Thought Content:  Rumination  Suicidal Thoughts:  Yes.  without intent/plan  Homicidal Thoughts:  No  Memory:  Immediate;   Good Recent;   Fair Remote;   Fair  Judgement:  Impaired  Insight:  Fair  Psychomotor Activity:  Decreased  Concentration:  Concentration: Good and Attention Span: Good  Recall:  [place] of Knowledge:  Good  Language:  Good  Akathisia:  Negative  Handed:  Right  AIMS (if indicated):     Assets:  Communication Skills Desire for Improvement Financial Resources/Insurance Housing Leisure Time Resilience Social Support Talents/Skills Transportation  ADL's:  Intact  Cognition:  WNL  Sleep:  Number of Hours: 4    Treatment Plan Summary: Daily contact with patient to assess and evaluate symptoms and progress in treatment and Medication management  Observation Level/Precautions:  15 minute checks  Laboratory:  Reviewed admission labs including blood alcohol level, urine drug screen, competency metabolic panel and CBC  Psychotherapy:  Group therapy and substance abuse groups   Medications:  Patient will receive Ativan alcohol detox treatment, supportive treatment and trazodone 50 mg at bedtime for insomnia lisinopril and hydrochlorothiazide for high blood pressure, and Nicoderm for nicotine dependence . Will assess further for antidepressant medication needs   Consultations:  As needed   Discharge Concerns:   safety   Estimated LOS:5-7 days  Other:     Physician Treatment Plan for Primary Diagnosis: <principal problem not  specified> Long Term Goal(s): Improvement in symptoms so as ready for discharge  Short Term Goals: Ability to identify changes in lifestyle to reduce recurrence of condition will improve, Ability to verbalize feelings will improve, Ability to disclose and discuss suicidal ideas and Ability to demonstrate self-control will improve  Physician Treatment Plan for Secondary Diagnosis: Active Problems:   MDD (major depressive disorder), recurrent severe, without psychosis ([place])  Long Term Goal(s): Improvement in symptoms so as ready for discharge  Short Term Goals: Ability to identify and develop effective coping behaviors will improve, Ability to maintain clinical  measurements within normal limits will improve, Compliance with prescribed medications will improve and Ability to identify triggers associated with substance abuse/mental health issues will improve  I certify that inpatient services furnished can reasonably be expected to improve the patient's condition.    Ambrose Finland, MD 6/16/201810:44 AM

## 2017-03-05 NOTE — [facility] Group Notes (Signed)
[place] [facility] LCSW Group Therapy Note  03/05/2017  and  10:15 AM  Type of Therapy and Topic:  Group Therapy: Avoiding Self-Sabotaging and Enabling Behaviors  Participation Level:  Minimal  Participation Quality:  Drowsy  Affect:  Flat  Cognitive:  Oriented  Insight:  None shared  Engagement in Therapy:  None noted   Therapeutic models used: Cognitive Behavioral Therapy,  Person-Centered Therapy and Motivational Interviewing  Modes of Intervention:  Discussion, Exploration, Orientation, Rapport Building, Socialization and Support   Summary of Progress/Problems:  The main focus of today's process group was for the patient to identify ways in which they have in the past sabotaged their own recovery. Motivational Interviewing was utilized to identify motivation they may have for wanting to change. Patient identified desire for discharge.   Sheilah Pigeon, LCSW

## 2017-03-06 MED ORDER — ALBUTEROL SULFATE HFA 108 (90 BASE) MCG/ACT IN AERS: 1.0000 | INHALATION_SPRAY | RESPIRATORY_TRACT | Status: DC | PRN

## 2017-03-06 MED ORDER — NICOTINE 21 MG/24HR TD PT24
21.0000 mg | MEDICATED_PATCH | Freq: Every day | TRANSDERMAL | Status: DC
  Administered 2017-03-07: 21 mg via TRANSDERMAL
  Filled 2017-03-06 (×3): qty 1

## 2017-03-06 MED ORDER — ALBUTEROL SULFATE HFA 108 (90 BASE) MCG/ACT IN AERS
INHALATION_SPRAY | RESPIRATORY_TRACT | Status: AC
  Filled 2017-03-06: qty 6.7

## 2017-03-06 MED ORDER — MOMETASONE FURO-FORMOTEROL FUM 200-5 MCG/ACT IN AERO
2.0000 | INHALATION_SPRAY | Freq: Two times a day (BID) | RESPIRATORY_TRACT | Status: DC
  Administered 2017-03-06 – 2017-03-07 (×3): 2 via RESPIRATORY_TRACT
  Filled 2017-03-06: qty 8.8

## 2017-03-06 NOTE — Progress Notes (Signed)
D.  Pt pleasant on approach, denies complaints at this time.  Pt did not get up for evening AA group, has remained in bed this shift.  Pt denies SI/HI/hallucinations at this time.  Minimal signs/symptoms of withdrawal.  A.  Support and encouragement offered, medication given as ordered  R. Pt remains safe on the unit, will continue to monitor.

## 2017-03-06 NOTE — Progress Notes (Signed)
DAR NOTE: Patient presents with anxious affect and depressed mood. Pt stated he a  good night sleep, fair appetite, normal energy level, and good concentration. Denies pain, auditory and visual hallucinations. Rates depression at 0, hopelessness at 0, and anxiety at 0.  Maintained on routine safety checks.  Medications given as prescribed.  Support and encouragement offered as needed.  Attended group and participated.  States goal for today is " stay out of bed and go to groups"  Patient observed socializing with peers in the dayroom.  Offered no complaint.

## 2017-03-06 NOTE — Progress Notes (Signed)
[facility] MD Progress Note  03/06/2017 1:20 PM Jeff Stevenson  MRN:  [id number] Subjective:  My tremors are the same. Im been vomiting since breakfast, and feel a little off but im okay.   Per nursing:  Pt pleasant on approach, denies complaints at this time.  Pt did not get up for evening AA group, has remained in bed this shift.  Pt denies SI/HI/hallucinations at this time.  Minimal signs/symptoms of withdrawal.  Support and encouragement offered, medication given as ordered    Objective: 56 year old male who presents to the ED with symptoms of worsening depression and suicidal ideations. Patient  has been compliant with his medication and inpatient psychiatric program including milieu therapy and group therapy. Although he continues to report ongoing cravings and withdraw symptoms he is able to ambulate to lunch and attend minimal amount of groups required. He denies any depression at this time however endorses ongoing anxiety.  Patient  has a disturbance of sleep and appetite.  Patient stated that he is feeling safer in the hospital contract for safety while in the hospital.  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 MDD (major depressive disorder), recurrent severe, without psychosis ([place]) [F33.2] 03/05/2017  . Onset of alcohol-induced mood disorder during intoxication ([place]) [H84.696, F10.94] 03/05/2017  . Encounter for screening colonoscopy [Z12.11] 01/31/2017  . Hepatitis C, chronic ([place]) [B18.2] 11/10/2016  . Elevated LFTs [R79.89] 10/05/2016  . NICOTINE ADDICTION [F17.200] 04/19/2008  . DEPRESSION [F32.9] 04/19/2008  . HYPERTENSION [I10] 04/19/2008   Total Time spent with patient: 20 minutes  Past Psychiatric History:Patient has a history of acute psychiatric hospitalization at [facility] about 25 years ago and also reportedly using acid at that time.   Past Medical  History:  Past Medical History:  Diagnosis Date  . ETOH abuse   . Hepatitis C   . Psychosis     Past Surgical History:  Procedure Laterality Date  . BELOW KNEE LEG AMPUTATION    . broke his back    . CHEST TUBE INSERTION    . GASTROSTOMY W/ FEEDING TUBE     and removal  . SPLENECTOMY    . TRACHEOSTOMY     and removal    Family History:  Family History  Problem Relation Age of Onset  . Breast cancer Mother   . Colon cancer Neg Hx    Family Psychiatric  History: None Social History:  History  Alcohol Use  . Yes    Comment: 4- 40 OZ DAILY     History  Drug Use  . Types: Marijuana    Comment: history of intranasal cocaine, no IV drug use, none currently     Social History   Social History  . Marital status: Divorced    Spouse name: N/A  . Number of children: N/A  . Years of education: N/A   Social History Main Topics  . Smoking status: Current Every Day Smoker    Packs/day: 1.00  . Smokeless tobacco: Never Used  . Alcohol use Yes     Comment: 4- 40 OZ DAILY  . Drug use: Yes    Types: Marijuana     Comment: history of intranasal cocaine, no IV drug use, none currently   . Sexual activity: Not Asked   Other Topics Concern  . None   Social History Narrative  . None   Additional Social History: Patient  has mother, 2 brothers and ex-wife who has been drinking alcohol. Patient has a 33 years old son who is also drinks alcohol. Patient has 3 dogs, 4 cats and mother at home. Patient has been considered disabled due to medical problems.   Sleep: Good  Appetite:  Fair  Current Medications: Current Facility-Administered Medications  Medication Dose Route Frequency Provider Last Rate Last Dose  . acetaminophen (TYLENOL) tablet 650 mg  650 mg Oral Q6H PRN Okonkwo, Justina A, NP      . albuterol (PROVENTIL HFA;VENTOLIN HFA) 108 (90 Base) MCG/ACT inhaler 1-2 puff  1-2 puff Inhalation Q4H PRN Lindon Romp A, NP      . albuterol (PROVENTIL HFA;VENTOLIN HFA) 108 (90  Base) MCG/ACT inhaler           . alum & mag hydroxide-simeth (MAALOX/MYLANTA) 200-200-20 MG/5ML suspension 30 mL  30 mL Oral Q4H PRN Okonkwo, Justina A, NP      . lisinopril (PRINIVIL,ZESTRIL) tablet 10 mg  10 mg Oral Daily Cobos, Myer Peer, MD   10 mg at 03/06/17 0813   And  . hydrochlorothiazide (MICROZIDE) capsule 12.5 mg  12.5 mg Oral Daily Cobos, Myer Peer, MD   12.5 mg at 03/06/17 0814  . hydrOXYzine (ATARAX/VISTARIL) tablet 25 mg  25 mg Oral Q6H PRN Ambrose Finland, MD   25 mg at 03/05/17 2141  . loperamide (IMODIUM) capsule 2-4 mg  2-4 mg Oral PRN Ambrose Finland, MD      . LORazepam (ATIVAN) tablet 1 mg  1 mg Oral Q6H PRN Ambrose Finland, MD   1 mg at 03/06/17 0705  . LORazepam (ATIVAN) tablet 1 mg  1 mg Oral TID Ambrose Finland, MD   1 mg at 03/06/17 1256   Followed by  . [START ON 03/07/2017] LORazepam (ATIVAN) tablet 1 mg  1 mg Oral BID Ambrose Finland, MD       Followed by  . [START ON 03/09/2017] LORazepam (ATIVAN) tablet 1 mg  1 mg Oral Daily Jonnalagadda, Janardhana, MD      . magnesium hydroxide (MILK OF MAGNESIA) suspension 30 mL  30 mL Oral Daily PRN Okonkwo, Justina A, NP      . mometasone-formoterol (DULERA) 200-5 MCG/ACT inhaler 2 puff  2 puff Inhalation BID Lindon Romp A, NP   2 puff at 03/06/17 0815  . multivitamin with minerals tablet 1 tablet  1 tablet Oral Daily Ambrose Finland, MD   1 tablet at 03/06/17 0814  . nicotine (NICODERM CQ - dosed in mg/24 hours) patch 21 mg  21 mg Transdermal Once Okonkwo, Justina A, NP      . ondansetron (ZOFRAN-ODT) disintegrating tablet 4 mg  4 mg Oral Q6H PRN Ambrose Finland, MD   4 mg at 03/06/17 0705  . thiamine (VITAMIN B-1) tablet 100 mg  100 mg Oral Daily Ambrose Finland, MD   100 mg at 03/06/17 0814  . traZODone (DESYREL) tablet 50 mg  50 mg Oral QHS PRN Lu Duffel, Justina A, NP   50 mg at 03/05/17 2141    Lab Results:  Results for orders placed or performed  during the hospital encounter of 03/04/17 (from the past 48 hour(s))  Comprehensive metabolic panel     Status: Abnormal   Collection Time: 03/04/17  3:41 PM  Result Value Ref Range   Sodium 136 135 - 145 mmol/L   Potassium 3.8 3.5 - 5.1 mmol/L   Chloride 100 (L) 101 - 111 mmol/L   CO2 21 (L) 22 - 32 mmol/L  Glucose, Bld 92 65 - 99 mg/dL   BUN 5 (L) 6 - 20 mg/dL   Creatinine, Ser 0.56 (L) 0.61 - 1.24 mg/dL   Calcium 8.5 (L) 8.9 - 10.3 mg/dL   Total Protein 8.2 (H) 6.5 - 8.1 g/dL   Albumin 4.0 3.5 - 5.0 g/dL   AST 123 (H) 15 - 41 U/L   ALT 100 (H) 17 - 63 U/L   Alkaline Phosphatase 108 38 - 126 U/L   Total Bilirubin 0.8 0.3 - 1.2 mg/dL   GFR calc non Af Amer >60 >60 mL/min   GFR calc Af Amer >60 >60 mL/min    Comment: (NOTE) The eGFR has been calculated using the CKD EPI equation. This calculation has not been validated in all clinical situations. eGFR's persistently <60 mL/min signify possible Chronic Kidney Disease.    Anion gap 15 5 - 15  Ethanol     Status: Abnormal   Collection Time: 03/04/17  3:41 PM  Result Value Ref Range   Alcohol, Ethyl (B) 312 (HH) <5 mg/dL    Comment:        LOWEST DETECTABLE LIMIT FOR SERUM ALCOHOL IS 5 mg/dL FOR MEDICAL PURPOSES ONLY CRITICAL RESULT CALLED TO, READ BACK BY AND VERIFIED WITH: CARDWELL,L ON 03/04/17 AT 1638 BY LOY,C   Salicylate level     Status: None   Collection Time: 03/04/17  3:41 PM  Result Value Ref Range   Salicylate Lvl <4.5 2.8 - 30.0 mg/dL  Acetaminophen level     Status: Abnormal   Collection Time: 03/04/17  3:41 PM  Result Value Ref Range   Acetaminophen (Tylenol), Serum <10 (L) 10 - 30 ug/mL    Comment:        THERAPEUTIC CONCENTRATIONS VARY SIGNIFICANTLY. A RANGE OF 10-30 ug/mL MAY BE AN EFFECTIVE CONCENTRATION FOR MANY PATIENTS. HOWEVER, SOME ARE BEST TREATED AT CONCENTRATIONS OUTSIDE THIS RANGE. ACETAMINOPHEN CONCENTRATIONS >150 ug/mL AT 4 HOURS AFTER INGESTION AND >50 ug/mL AT 12 HOURS AFTER  INGESTION ARE OFTEN ASSOCIATED WITH TOXIC REACTIONS.   cbc     Status: Abnormal   Collection Time: 03/04/17  3:41 PM  Result Value Ref Range   WBC 8.3 4.0 - 10.5 K/uL   RBC 5.09 4.22 - 5.81 MIL/uL   Hemoglobin 17.7 (H) 13.0 - 17.0 g/dL   HCT 50.4 39.0 - 52.0 %   MCV 99.0 78.0 - 100.0 fL   MCH 34.8 (H) 26.0 - 34.0 pg   MCHC 35.1 30.0 - 36.0 g/dL   RDW 12.6 11.5 - 15.5 %   Platelets 158 150 - 400 K/uL  Ethanol     Status: Abnormal   Collection Time: 03/04/17  6:47 PM  Result Value Ref Range   Alcohol, Ethyl (B) 234 (H) <5 mg/dL    Comment:        LOWEST DETECTABLE LIMIT FOR SERUM ALCOHOL IS 5 mg/dL FOR MEDICAL PURPOSES ONLY   Rapid urine drug screen (hospital performed)     Status: Abnormal   Collection Time: 03/04/17  8:20 PM  Result Value Ref Range   Opiates NONE DETECTED NONE DETECTED   Cocaine NONE DETECTED NONE DETECTED   Benzodiazepines NONE DETECTED NONE DETECTED   Amphetamines NONE DETECTED NONE DETECTED   Tetrahydrocannabinol POSITIVE (A) NONE DETECTED   Barbiturates NONE DETECTED NONE DETECTED    Comment:        DRUG SCREEN FOR MEDICAL PURPOSES ONLY.  IF CONFIRMATION IS NEEDED FOR ANY PURPOSE, NOTIFY LAB WITHIN 5 DAYS.  LOWEST DETECTABLE LIMITS FOR URINE DRUG SCREEN Drug Class       Cutoff (ng/mL) Amphetamine      1000 Barbiturate      200 Benzodiazepine   673 Tricyclics       419 Opiates          300 Cocaine          300 THC              50     Blood Alcohol level:  Lab Results  Component Value Date   ETH 234 (H) 03/04/2017   ETH 312 (HH) 37/90/2409    Metabolic Disorder Labs: No results found for: HGBA1C, MPG No results found for: PROLACTIN No results found for: CHOL, TRIG, HDL, CHOLHDL, VLDL, LDLCALC  Physical Findings: AIMS: Facial and Oral Movements Muscles of Facial Expression: None, normal Lips and Perioral Area: None, normal Jaw: None, normal Tongue: None, normal,Extremity Movements Upper (arms, wrists, hands, fingers):  None, normal Lower (legs, knees, ankles, toes): None, normal, Trunk Movements Neck, shoulders, hips: None, normal, Overall Severity Severity of abnormal movements (highest score from questions above): None, normal Incapacitation due to abnormal movements: None, normal Patient's awareness of abnormal movements (rate only patient's report): No Awareness, Dental Status Current problems with teeth and/or dentures?: No Does patient usually wear dentures?: No  CIWA:  CIWA-Ar Total: 12 COWS:     Musculoskeletal: Strength & Muscle Tone: within normal limits Gait & Station: normal Patient leans: N/A  Psychiatric Specialty Exam: Physical Exam  ROS  Blood pressure 98/74, pulse (!) 101, temperature 97.6 F (36.4 C), temperature source Oral, resp. rate 20, height 5' 11"  (1.803 m), weight 74.8 kg (165 lb).Body mass index is 23.01 kg/m.  General Appearance: Disheveled and appears older than age states  Eye Contact:  Fair  Speech:  Clear and Coherent and Normal Rate  Volume:  Normal  Mood:  Depressed  Affect:  Depressed and Flat  Thought Process:  Linear and Descriptions of Associations: Intact  Orientation:  Full (Time, Place, and Person)  Thought Content:  Logical  Suicidal Thoughts:  No  Homicidal Thoughts:  No  Memory:  Immediate;   Fair Recent;   Fair  Judgement:  Impaired  Insight:  Lacking  Psychomotor Activity:  Normal  Concentration:  Concentration: Fair and Attention Span: Fair  Recall:  [employer] of Knowledge:  Fair  Language:  Fair  Akathisia:  No  Handed:  Right  AIMS (if indicated):     Assets:  Communication Skills Desire for Improvement Financial Resources/Insurance Leisure Time Vocational/Educational  ADL's:  Intact  Cognition:  WNL  Sleep:  Number of Hours: 6.5     Treatment Plan Summary: Daily contact with patient to assess and evaluate symptoms and progress in treatment and Medication management 1 Admit for crisis management and stabilization.  2.  Medication management to reduce symptoms to baseline and improved the patient's overall level of functioning. Closely monitor the side effects, efficacy and therapeutic response of medication. Hep C testing pending, referral has been placed to [facility] he will need to follow up at discharge.  3. Treat health problem as indicated. Patient will receive Ativan alcohol detox treatment, supportive treatment and trazodone 50 mg at bedtime for insomnia lisinopril and hydrochlorothiazide for high blood pressure, and Nicoderm for nicotine dependence . Will assess further for antidepressant medication needs  4. Developed treatment plan to decrease the risk of relapse upon discharge and to reduce the need for readmission.  5.  Psychosocial education regarding relapse prevention in self-care.  6. Healthcare followup as needed for medical problems and called consults as indicated.  7. Increase collateral information.  8. Restart home medication where appropriate  9. Encouraged to participate and verbalize into group milieu therapy.   Nanci Pina, FNP 03/06/2017, 1:20 PM

## 2017-03-06 NOTE — Progress Notes (Signed)
Patient did attend the evening speaker AA meeting.  

## 2017-03-06 NOTE — Progress Notes (Signed)
D.  Pt pleasant on approach, denies complaints at this time other than cigarette withdrawal.  Pt was positive for evening AA group, interacting appropriately with peers on the unit.  Pt denies SI/HI/hallucinations at this time.  A.  Support and encouragement offered, medication given as ordered  R. Pt remains safe on the unit, will continue to monitor.

## 2017-03-06 NOTE — BHH Group Notes (Signed)
[place] LCSW Group Therapy  03/06/2017 10 AM  Type of Therapy:  Group Therapy  Participation Level:  Did Not Attend; entered group room at close of group.    Sheilah Pigeon, LCSW

## 2017-03-06 NOTE — BHH Group Notes (Signed)
[facility] Group Notes:  (Nursing/MHT/Case Management/Adjunct)  Date:  03/06/2017  Time:  6:32 PM Type of Therapy:  Psychoeducational Skills  Participation Level:  Active  Participation Quality:  Appropriate  Affect:  Appropriate  Cognitive:  Appropriate  Insight:  Appropriate  Engagement in Group:  Engaged  Modes of Intervention:  Problem-solving  Summary of Progress/Problems: Discussed the important of choosing healthy leisure activities. Group encouraged to surround themselves with positive and healthy group/support system when changing to a healthy life style.       Wolfgang Phoenix 03/06/2017, 6:32 PM

## 2017-03-07 ENCOUNTER — Encounter (HOSPITAL_COMMUNITY): Admission: RE | Payer: Self-pay | Source: Ambulatory Visit

## 2017-03-07 ENCOUNTER — Encounter (HOSPITAL_COMMUNITY): Payer: Self-pay | Admitting: Psychiatry

## 2017-03-07 ENCOUNTER — Ambulatory Visit (HOSPITAL_COMMUNITY): Admission: RE | Admit: 2017-03-07 | Payer: Medicare Other | Source: Ambulatory Visit | Admitting: Internal Medicine

## 2017-03-07 DIAGNOSIS — F1020 Alcohol dependence, uncomplicated: Secondary | ICD-10-CM | POA: Clinically undetermined

## 2017-03-07 DIAGNOSIS — F1024 Alcohol dependence with alcohol-induced mood disorder: Secondary | ICD-10-CM

## 2017-03-07 DIAGNOSIS — F1290 Cannabis use, unspecified, uncomplicated: Secondary | ICD-10-CM

## 2017-03-07 DIAGNOSIS — F3289 Other specified depressive episodes: Secondary | ICD-10-CM

## 2017-03-07 DIAGNOSIS — F17210 Nicotine dependence, cigarettes, uncomplicated: Secondary | ICD-10-CM

## 2017-03-07 DIAGNOSIS — F332 Major depressive disorder, recurrent severe without psychotic features: Principal | ICD-10-CM

## 2017-03-07 SURGERY — COLONOSCOPY WITH PROPOFOL
Anesthesia: Monitor Anesthesia Care

## 2017-03-07 MED ORDER — HYDROXYZINE HCL 25 MG PO TABS: ORAL_TABLET | ORAL | 0 refills | Status: AC

## 2017-03-07 MED ORDER — TRAZODONE HCL 50 MG PO TABS: 50.0000 mg | ORAL_TABLET | Freq: Every evening | ORAL | 0 refills | Status: AC | PRN

## 2017-03-07 MED ORDER — NICOTINE 21 MG/24HR TD PT24: 21.0000 mg | MEDICATED_PATCH | Freq: Every day | TRANSDERMAL | 0 refills | Status: DC

## 2017-03-07 MED ORDER — LISINOPRIL-HYDROCHLOROTHIAZIDE 10-12.5 MG PO TABS: 1.0000 | ORAL_TABLET | Freq: Every day | ORAL | 0 refills | Status: AC

## 2017-03-07 MED ORDER — VENTOLIN HFA 108 (90 BASE) MCG/ACT IN AERS: 1.0000 | INHALATION_SPRAY | RESPIRATORY_TRACT | Status: AC | PRN

## 2017-03-07 MED ORDER — SYMBICORT 160-4.5 MCG/ACT IN AERO: 2.0000 | INHALATION_SPRAY | Freq: Two times a day (BID) | RESPIRATORY_TRACT | 0 refills | Status: AC

## 2017-03-07 NOTE — Tx Team (Signed)
Interdisciplinary Treatment and Diagnostic Plan Update  03/07/2017 Time of Session: 0930 Jeff Stevenson MRN: [id number]  Principal Diagnosis: Onset of alcohol-induced mood disorder during intoxication [facility])  Secondary Diagnoses: Principal Problem:   Onset of alcohol-induced mood disorder during intoxication ([place]) Active Problems:   MDD (major depressive disorder), recurrent severe, without psychosis ([place])   Current Medications:  Current Facility-Administered Medications  Medication Dose Route Frequency Provider Last Rate Last Dose  . acetaminophen (TYLENOL) tablet 650 mg  650 mg Oral Q6H PRN Okonkwo, Justina A, NP      . albuterol (PROVENTIL HFA;VENTOLIN HFA) 108 (90 Base) MCG/ACT inhaler 1-2 puff  1-2 puff Inhalation Q4H PRN Lindon Romp A, NP      . alum & mag hydroxide-simeth (MAALOX/MYLANTA) 200-200-20 MG/5ML suspension 30 mL  30 mL Oral Q4H PRN Okonkwo, Justina A, NP      . lisinopril (PRINIVIL,ZESTRIL) tablet 10 mg  10 mg Oral Daily Cobos, Myer Peer, MD   10 mg at 03/07/17 0757   And  . hydrochlorothiazide (MICROZIDE) capsule 12.5 mg  12.5 mg Oral Daily Cobos, Fernando A, MD   12.5 mg at 03/07/17 0757  . hydrOXYzine (ATARAX/VISTARIL) tablet 25 mg  25 mg Oral Q6H PRN Ambrose Finland, MD   25 mg at 03/06/17 2107  . loperamide (IMODIUM) capsule 2-4 mg  2-4 mg Oral PRN Ambrose Finland, MD      . LORazepam (ATIVAN) tablet 1 mg  1 mg Oral Q6H PRN Ambrose Finland, MD   1 mg at 03/06/17 2107  . LORazepam (ATIVAN) tablet 1 mg  1 mg Oral BID Ambrose Finland, MD       Followed by  . [START ON 03/09/2017] LORazepam (ATIVAN) tablet 1 mg  1 mg Oral Daily Jonnalagadda, Janardhana, MD      . magnesium hydroxide (MILK OF MAGNESIA) suspension 30 mL  30 mL Oral Daily PRN Okonkwo, Justina A, NP      . mometasone-formoterol (DULERA) 200-5 MCG/ACT inhaler 2 puff  2 puff Inhalation BID Lindon Romp A, NP   2 puff at 03/07/17 0758  . multivitamin with minerals tablet 1  tablet  1 tablet Oral Daily Ambrose Finland, MD   1 tablet at 03/07/17 0757  . nicotine (NICODERM CQ - dosed in mg/24 hours) patch 21 mg  21 mg Transdermal Daily Cobos, Myer Peer, MD   21 mg at 03/07/17 0640  . ondansetron (ZOFRAN-ODT) disintegrating tablet 4 mg  4 mg Oral Q6H PRN Ambrose Finland, MD   4 mg at 03/06/17 0705  . thiamine (VITAMIN B-1) tablet 100 mg  100 mg Oral Daily Ambrose Finland, MD   100 mg at 03/07/17 0757  . traZODone (DESYREL) tablet 50 mg  50 mg Oral QHS PRN Lu Duffel, Justina A, NP   50 mg at 03/06/17 2107   PTA Medications: Prescriptions Prior to Admission  Medication Sig Dispense Refill Last Dose  . lisinopril-hydrochlorothiazide (PRINZIDE,ZESTORETIC) 10-12.5 MG tablet Take 1 tablet by mouth daily.   Past Week at Unknown time  . Na Sulfate-K Sulfate-Mg Sulf (SUPREP BOWEL PREP KIT) 17.5-3.13-1.6 GM/180ML SOLN Take 1 kit by mouth as directed. 1 Bottle 0   . SYMBICORT 160-4.5 MCG/ACT inhaler Inhale 2 puffs into the lungs 2 (two) times daily.   Past Week at Unknown time  . traZODone (DESYREL) 50 MG tablet Take 50 mg by mouth at bedtime as needed (for sleep.).    Past Month at Unknown time  . 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   Physician Treatment Plan for Primary Diagnosis: Onset of alcohol-induced mood disorder during intoxication ([place]) Long Term Goal(s): Improvement in symptoms so as ready for discharge Improvement in symptoms so as ready for discharge   Short Term Goals: Ability to identify changes in lifestyle to reduce recurrence of condition will improve Ability to verbalize feelings will improve Ability to disclose and discuss suicidal  ideas Ability to demonstrate self-control will improve Ability to identify and develop effective coping behaviors will improve Ability to maintain clinical measurements within normal limits will improve Compliance with prescribed medications will improve Ability to identify triggers associated with substance abuse/mental health issues will improve  Medication Management: Evaluate patient's response, side effects, and tolerance of medication regimen.  Therapeutic Interventions: 1 to 1 sessions, Unit Group sessions and Medication administration.  Evaluation of Outcomes: Met  Physician Treatment Plan for Secondary Diagnosis: Principal Problem:   Onset of alcohol-induced mood disorder during intoxication ([place]) Active Problems:   MDD (major depressive disorder), recurrent severe, without psychosis ([place])  Long Term Goal(s): Improvement in symptoms so as ready for discharge Improvement in symptoms so as ready for discharge   Short Term Goals: Ability to identify changes in lifestyle to reduce recurrence of condition will improve Ability to verbalize feelings will improve Ability to disclose and discuss suicidal ideas Ability to demonstrate self-control will improve Ability to identify and develop effective coping behaviors will improve Ability to maintain clinical measurements within normal limits will improve Compliance with prescribed medications will improve Ability to identify triggers associated with substance abuse/mental health issues will improve     Medication Management: Evaluate patient's response, side effects, and tolerance of medication regimen.  Therapeutic Interventions: 1 to 1 sessions, Unit Group sessions and Medication administration.  Evaluation of Outcomes: Met   RN Treatment Plan for Primary Diagnosis: Onset of alcohol-induced mood disorder during intoxication ([place]) Long Term Goal(s): Knowledge of disease and therapeutic regimen to maintain health will  improve  Short Term Goals: Ability to remain free from injury will improve, Ability to disclose and discuss suicidal ideas and Ability to identify and develop effective coping behaviors will improve  Medication Management: RN will administer medications as ordered by provider, will assess and evaluate patient's response and provide education to patient for prescribed medication. RN will report any adverse and/or side effects to prescribing provider.  Therapeutic Interventions: 1 on 1 counseling sessions, Psychoeducation, Medication administration, Evaluate responses to treatment, Monitor vital signs and CBGs as ordered, Perform/monitor CIWA, COWS, AIMS and Fall Risk screenings as ordered, Perform wound care treatments as ordered.  Evaluation of Outcomes: Met   LCSW Treatment Plan for Primary Diagnosis: Onset of alcohol-induced mood disorder during intoxication ([place]) Long Term Goal(s): Safe transition to appropriate next level of care at discharge, Engage patient in therapeutic group addressing interpersonal concerns.  Short Term Goals: Engage patient in aftercare planning with referrals and resources, Facilitate patient progression through stages of change regarding substance use diagnoses and concerns and Increase skills for wellness and recovery  Therapeutic Interventions: Assess for all discharge needs, 1 to 1 time with Social worker, Explore available resources and support systems, Assess for adequacy in community support network, Educate family and significant other(s) on suicide prevention, Complete Psychosocial Assessment, Interpersonal group therapy.  Evaluation of Outcomes: Met  Progress  in Treatment: Attending groups: Yes Participating in groups: No. New to unit. Continuing to assess.  Taking medication as prescribed: Yes. Toleration medication: Yes. Family/Significant other contact made:SPE completed with pt's mother.  Patient understands diagnosis: Yes. Discussing patient  identified problems/goals with staff: Yes. Medical problems stabilized or resolved: Yes. Denies suicidal/homicidal ideation: Yes. Issues/concerns per patient self-inventory: No. Other: n/a  New problem(s) identified: No, Describe:  n/a  New Short Term/Long Term Goal(s): elimination of SI thoughts; medication stabilization; alcohol detox, development of comprehensive mental wellness/sobriety plan.   Discharge Plan or Barriers: CSW assessing for appropriate referrals. Pt [place] in [place]-has [place] and [place]. He would like to follow-up with his current PCP Dr. Legrand Rams and attend AA meetings. Appt made with current PCP and AA list provided for [place]. Pt's mother will pick him up this afternoon.   Reason for Continuation of Hospitalization: none  Estimated Length of Stay: discharge today   Attendees: Patient: 03/07/2017 8:24 AM  Physician: Dr. Shea Evans MD 03/07/2017 8:24 AM  Nursing: Jonni Sanger RN 03/07/2017 8:24 AM  RN Care Manager: Lars Pinks CM 03/07/2017 8:24 AM  Social Worker: Maxie Better, LCSW 03/07/2017 8:24 AM  Recreational Therapist: x 03/07/2017 8:24 AM  Other: Lindell Spar NP; Ricky Ala NP 03/07/2017 8:24 AM  Other:  03/07/2017 8:24 AM  Other: 03/07/2017 8:24 AM    Scribe for Treatment Team: [place], LCSW 03/07/2017 8:24 AM

## 2017-03-07 NOTE — Progress Notes (Signed)
Recreation Therapy Notes  Date: 03/07/17 Time: 0930 Location: [address] Group Room  Group Topic: Stress Management  Goal Area(s) Addresses:  Patient will verbalize importance of using healthy stress management.  Patient will identify positive emotions associated with healthy stress management.   Intervention: Stress Management  Activity :  Guided Imagery.  LRT introduced the stress management technique of guided imagery.  LRT read a script to allow patients to engage in the technique.  Patients were to follow along as the script was read to fully participate.  Education:  Stress Management, Discharge Planning.   Education Outcome: Acknowledges edcuation/In group clarification offered/Needs additional education  Clinical Observations/Feedback: Pt did not attend group.   Victorino Sparrow, LRT/CTRS         Ria Comment, Elayne Gruver A 03/07/2017 2:23 PM

## 2017-03-07 NOTE — BHH Suicide Risk Assessment (Signed)
[facility] Discharge Suicide Risk Assessment   Principal Problem: Alcohol-induced depressive disorder with moderate or severe use disorder [facility]) Discharge Diagnoses:  Patient Active Problem List   Diagnosis Date Noted  . Alcohol use disorder, severe, dependence ([place]) [F10.20] 03/07/2017  . Alcohol-induced depressive disorder with moderate or severe use disorder ([place]) [F10.24, F32.89] 03/07/2017  . Encounter for screening colonoscopy [Z12.11] 01/31/2017  . Hepatitis C, chronic ([place]) [B18.2] 11/10/2016  . Elevated LFTs [R79.89] 10/05/2016  . NICOTINE ADDICTION [F17.200] 04/19/2008  . DEPRESSION [F32.9] 04/19/2008  . HYPERTENSION [I10] 04/19/2008    Total Time spent with patient: 30 minutes  Musculoskeletal: Strength & Muscle Tone: within normal limits Gait & Station: normal Patient leans: N/A  Psychiatric Specialty Exam: Review of Systems  Psychiatric/Behavioral: Positive for substance abuse. Negative for depression, hallucinations and suicidal ideas.  All other systems reviewed and are negative.   Blood pressure 115/82, pulse 97, temperature 98.1 F (36.7 C), temperature source Oral, resp. rate 16, height 5\' 11"  (1.803 m), weight 74.8 kg (165 lb).Body mass index is 23.01 kg/m.  General Appearance: Casual  Eye Contact::  Fair  Speech:  Clear and Coherent409  Volume:  Normal  Mood:  Euthymic  Affect:  Appropriate  Thought Process:  Goal Directed and Descriptions of Associations: Intact  Orientation:  Full (Time, Place, and Person)  Thought Content:  Logical  Suicidal Thoughts:  No  Homicidal Thoughts:  No  Memory:  Immediate;   Fair Recent;   Fair Remote;   Fair  Judgement:  Fair  Insight:  Fair  Psychomotor Activity:  Normal  Concentration:  Fair  Recall:  [employer] of Knowledge:Fair  Language: Fair  Akathisia:  No  Handed:  Right  AIMS (if indicated):     Assets:  Communication Skills Desire for Improvement  Sleep:  Number of Hours: 4.25  Cognition: WNL  ADL's:   Intact   Mental Status Per Nursing Assessment::   On Admission:  NA  Demographic Factors:  Caucasian  Loss Factors: NA  Historical Factors: Impulsivity  Risk Reduction Factors:   Positive social support and Positive therapeutic relationship  Continued Clinical Symptoms:  Previous Psychiatric Diagnoses and Treatments  Cognitive Features That Contribute To Risk:  None    Suicide Risk:  Minimal: No identifiable suicidal ideation.  Patients presenting with no risk factors but with morbid ruminations; may be classified as minimal risk based on the severity of the depressive symptoms  Follow-up Information    Dr. Legrand Rams Follow up on 03/14/2017.   Why:  Appt with Dr. Legrand Rams for hospital follow-up/medication management at 10:30am. Thank you.  Contact information: [place], [place] [address] Phone: [phone] Fax: [phone]          Plan Of Care/Follow-up recommendations:  Activity:  no restrictions Diet:  regular Tests:  as needed Other:  follow up with aftercare  Aldin Drees, MD 03/07/2017, 11:49 AM

## 2017-03-07 NOTE — Progress Notes (Signed)
Pt discharged home with his mom. Pt was ambulatory, stable and appreciative at that time. All papers and prescriptions were given and valuables returned. Verbal understanding expressed. Denies SI/HI and A/VH. Pt given opportunity to express concerns and ask questions.  

## 2017-03-07 NOTE — Plan of Care (Signed)
Problem: Activity: Goal: Interest or engagement in activities will improve Outcome: Progressing Pt did attend groups today

## 2017-03-07 NOTE — Discharge Summary (Signed)
Physician Discharge Summary Note  Patient:  Jeff Stevenson is an 56 y.o., male MRN:  [id number] DOB:  [birth year] Patient phone:  [phone] (home)  Patient address:   [place] [address],  Total Time spent with patient: 30 minutes  Date of Admission:  03/04/2017  Date of Discharge: 03-07-17  Reason for Admission: Per HPI- ASHFORD CLOUSE is a 56 years old Caucasian male admitted to behavioral health Hospital emergently and involuntarily(IVC) from [place] emergency department for increased symptoms of depression and suicidal ideation. Patient is also reportedly intoxicated with alcohol. Patient admitted for drinking alcohol x 35 years and smoking marijuana and lives with his mother. Patient was recently diagnosed with alcoholic hepatitis but not received medication therapy and PCP told him he needs to be detox treatment. Patient denied current suicide ideation, intention or plans. He [name] about 30 years ago for suicide attempt and antisocial traits while using acid. Patient is willing to participate alcohol detox treatment and needed to be taking trazodone for insomnia but stated he does not need medication for depression because it is due to drinking alcohol. Patient blood alcohol level is 312 on arrival and also elevated liver enzymes with AST and ALT. Patient urine drug screen is positive for tetrahydrocannabinol.  Principal Problem: Onset of alcohol-induced mood disorder during intoxication [facility])  Discharge Diagnoses: Patient Active Problem List   Diagnosis Date Noted  . MDD (major depressive disorder), recurrent severe, without psychosis ([place]) [F33.2] 03/05/2017  . Onset of alcohol-induced mood disorder during intoxication ([place]) [O13.086, F10.94] 03/05/2017  . Encounter for screening colonoscopy [Z12.11] 01/31/2017  . Hepatitis C, chronic ([place]) [B18.2] 11/10/2016  . Elevated LFTs [R79.89] 10/05/2016  . NICOTINE ADDICTION  [F17.200] 04/19/2008  . DEPRESSION [F32.9] 04/19/2008  . HYPERTENSION [I10] 04/19/2008   Past Psychiatric History: Major depressive disorder, recurrent  Past Medical History:  Past Medical History:  Diagnosis Date  . ETOH abuse   . Hepatitis C   . Psychosis     Past Surgical History:  Procedure Laterality Date  . BELOW KNEE LEG AMPUTATION    . broke his back    . CHEST TUBE INSERTION    . GASTROSTOMY W/ FEEDING TUBE     and removal  . SPLENECTOMY    . TRACHEOSTOMY     and removal    Family History:  Family History  Problem Relation Age of Onset  . Breast cancer Mother   . Colon cancer Neg Hx    Family Psychiatric  History: See H&P  Social History:  History  Alcohol Use  . Yes    Comment: 4- 40 OZ DAILY     History  Drug Use  . Types: Marijuana    Comment: history of intranasal cocaine, no IV drug use, none currently     Social History   Social History  . Marital status: Divorced    Spouse name: N/A  . Number of children: N/A  . Years of education: N/A   Social History Main Topics  . Smoking status: Current Every Day Smoker    Packs/day: 1.00  . Smokeless tobacco: Never Used  . Alcohol use Yes     Comment: 4- 40 OZ DAILY  . Drug use: Yes    Types: Marijuana     Comment: history of intranasal cocaine, no IV drug use, none currently   . Sexual activity: Not Asked   Other Topics Concern  . None   Social  History Narrative  . None   Hospital Course: JOSEPHMICHAEL LISENBEE was admitted for Onset of alcohol-induced mood disorder during intoxication [facility])  and crisis management.  Pt was treated discharged with the medications listed below under Medication List.  Medical problems were identified and treated as needed.  Home medications were restarted as appropriate.  Improvement was monitored by observation and [name] 's daily report of symptom reduction.  Emotional and mental status was monitored by daily self-inventory reports completed by [name]  and clinical staff.         [name] was evaluated by the treatment team for stability and plans for continued recovery upon discharge. CATO LIBURD 's motivation was an integral factor for scheduling further treatment. Employment, transportation, bed availability, health status, family support, and any pending legal issues were also considered during hospital stay. Pt was offered further treatment options upon discharge including but not limited to Residential, Intensive Outpatient, and Outpatient treatment.  [name] will follow up with the services as listed below under Follow Up Information.     Upon completion of this admission the patient was both mentally and medically stable for discharge denying suicidal/homicidal ideation, auditory/visual/tactile hallucinations, delusional thoughts and paranoia.    [name] responded well to treatment with trazodone 50 mg. EtoH Detox protocol  without adverse effects. Pt demonstrated improvement without reported or observed adverse effects to the point of stability appropriate for outpatient management. Pertinent labs include: CBC  which outpatient follow-up is necessary for lab recheck as mentioned below. Reviewed CBC, CMP, BAL + 234, and UDS+ THC; all unremarkable aside from noted exceptions.    Physical Findings: AIMS: Facial and Oral Movements Muscles of Facial Expression: None, normal Lips and Perioral Area: None, normal Jaw: None, normal Tongue: None, normal,Extremity Movements Upper (arms, wrists, hands, fingers): None, normal Lower (legs, knees, ankles, toes): None, normal, Trunk Movements Neck, shoulders, hips: None, normal, Overall Severity Severity of abnormal movements (highest score from questions above): None, normal Incapacitation due to abnormal movements: None, normal Patient's awareness of abnormal movements (rate only patient's report): No Awareness, Dental Status Current problems with teeth and/or dentures?:  No Does patient usually wear dentures?: No  CIWA:  CIWA-Ar Total: 6 COWS:     Musculoskeletal: Strength & Muscle Tone: within normal limits Gait & Station: normal Patient leans: N/A  Psychiatric Specialty Exam: See SRA by MD Physical Exam  Constitutional: He appears well-developed.  HENT:  Head: Normocephalic.  Eyes: Pupils are equal, round, and reactive to light.  Neck: Normal range of motion.  Cardiovascular: Normal rate.   GI: Soft.  Genitourinary:  Genitourinary Comments: Deferred  Musculoskeletal: Normal range of motion.  Neurological: He is alert.  Skin: Skin is warm.  Psychiatric: He has a normal mood and affect. His behavior is normal.    Review of Systems  Neurological: Negative.   Endo/Heme/Allergies: Negative.   Psychiatric/Behavioral: Positive for depression (Stable) and substance abuse (Hx. Alcohol/THC use disorder). Negative for hallucinations, memory loss and suicidal ideas. The patient has insomnia (Stable). The patient is not nervous/anxious.     Blood pressure 115/82, pulse 97, temperature 98.1 F (36.7 C), temperature source Oral, resp. rate 16, height '5\' 11"'$  (1.803 m), weight 74.8 kg (165 lb).Body mass index is 23.01 kg/m.  See H&P   Have you used any form of tobacco in the last 30 days? (Cigarettes, Smokeless Tobacco, Cigars, and/or Pipes): Yes  Has this patient used any form of tobacco in  the last 30 days? (Cigarettes, Smokeless Tobacco, Cigars, and/or Pipes)  Yes, A prescription for an FDA-approved tobacco cessation medication was offered at discharge and the patient refused  Blood Alcohol level:  Lab Results  Component Value Date   ETH 234 (H) 03/04/2017   ETH 312 (HH) 29/47/6546   Metabolic Disorder Labs:  No results found for: HGBA1C, MPG No results found for: PROLACTIN No results found for: CHOL, TRIG, HDL, CHOLHDL, VLDL, LDLCALC  See Psychiatric Specialty Exam and Suicide Risk Assessment completed by Attending Physician prior to  discharge.  Discharge destination:  Home  Is patient on multiple antipsychotic therapies at discharge:  No   Has Patient had three or more failed trials of antipsychotic monotherapy by history:  No  Recommended Plan for Multiple Antipsychotic Therapies: NA  Allergies as of 03/07/2017   No Known Allergies     Medication List    STOP taking these medications   Na Sulfate-K Sulfate-Mg Sulf 17.5-3.13-1.6 GM/180ML Soln Commonly known as:  SUPREP BOWEL PREP KIT     TAKE these medications     Indication  hydrOXYzine 25 MG tablet Commonly known as:  ATARAX/VISTARIL Take 1 tablet (25 mg) four times daily as needed: For anxiety  Indication:  Anxiety Neurosis   lisinopril-hydrochlorothiazide 10-12.5 MG tablet Commonly known as:  PRINZIDE,ZESTORETIC Take 1 tablet by mouth daily. For high blood pressure What changed:  additional instructions  Indication:  High Blood Pressure Disorder   nicotine 21 mg/24hr patch Commonly known as:  NICODERM CQ - dosed in mg/24 hours Place 1 patch (21 mg total) onto the skin daily. For Nicotine withdrawal Start taking on:  03/08/2017  Indication:  Nicotine Addiction   SYMBICORT 160-4.5 MCG/ACT inhaler Generic drug:  budesonide-formoterol Inhale 2 puffs into the lungs 2 (two) times daily. For Asthma What changed:  additional instructions  Indication:  Asthma   traZODone 50 MG tablet Commonly known as:  DESYREL Take 1 tablet (50 mg total) by mouth at bedtime as needed for sleep. What changed:  reasons to take this  Indication:  Trouble Sleeping   VENTOLIN HFA 108 (90 Base) MCG/ACT inhaler Generic drug:  albuterol Inhale 1 puff into the lungs every 4 (four) hours as needed for wheezing or shortness of breath. What changed:  how much to take  how to take this  when to take this  reasons to take this  Indication:  Asthma      Follow-up Information    Dr. Legrand Rams Follow up on 03/14/2017.   Why:  Appt with Dr. Legrand Rams for hospital  follow-up/medication management at 10:30am. Thank you.  Contact information: [place], [place] [address] Phone: [phone] Fax: [phone]         Follow-up recommendations: Activity:  As tolerated Diet: As recommended by your primary care doctor. Keep all scheduled follow-up appointments as recommended.   Comments: Patient is instructed prior to discharge to: Take all medications as prescribed by his/her mental healthcare provider. Report any adverse effects and or reactions from the medicines to his/her outpatient provider promptly. Patient has been instructed & cautioned: To not engage in alcohol and or illegal drug use while on prescription medicines. In the event of worsening symptoms, patient is instructed to call the crisis hotline, 911 and or go to the nearest ED for appropriate evaluation and treatment of symptoms. To follow-up with his/her primary care provider for your other medical issues, concerns and or health care needs.   Signed: Derrill Center, NP 03/07/2017, 10:20  AM

## 2017-03-07 NOTE — Progress Notes (Signed)
  [facility] Adult Case Management Discharge Plan :  Will you be returning to the same living situation after discharge:  Yes,  home At discharge, do you have transportation home?: Yes,  pt's mother Do you have the ability to pay for your medications: Yes,  medicare  Release of information consent forms completed and submitted to medical records by CSW.  Patient to Follow up at: Follow-up Information    Dr. Legrand Rams Follow up on 03/14/2017.   Why:  Appt with Dr. Legrand Rams for hospital follow-up/medication management at 10:30am. Thank you.  Contact information: [place], [place] [address] Phone: [phone] Fax: [phone]          Next level of care provider has access to [place] and Suicide Prevention discussed: Yes,  SPE completed with pt's mother and pt. SPI pamphlet and Mobile Crisis information provided.  Have you used any form of tobacco in the last 30 days? (Cigarettes, Smokeless Tobacco, Cigars, and/or Pipes): Yes  Has patient been referred to the Quitline?: Patient refused referral  Patient has been referred for addiction treatment: Yes  Caylin Nass N Smart LCSW 03/07/2017, 9:39 AM

## 2017-03-07 NOTE — BHH Suicide Risk Assessment (Signed)
[place] INPATIENT:  Family/Significant Other Suicide Prevention Education  Suicide Prevention Education:  Education Completed; Jeff Stevenson (pt's mother) [phone] has been identified by the patient as the family member/significant other with whom the patient will be residing, and identified as the person(s) who will aid the patient in the event of a mental health crisis (suicidal ideations/suicide attempt).  With written consent from the patient, the family member/significant other has been provided the following suicide prevention education, prior to the and/or following the discharge of the patient.  The suicide prevention education provided includes the following:  Suicide risk factors  Suicide prevention and interventions  National Suicide Hotline telephone number  [facility] assessment telephone number  [facility] Emergency Assistance Van and/or Residential Mobile Crisis Unit telephone number  Request made of family/significant other to:  Remove weapons (e.g., guns, rifles, knives), all items previously/currently identified as safety concern.    Remove drugs/medications (over-the-counter, prescriptions, illicit drugs), all items previously/currently identified as a safety concern.  The family member/significant other verbalizes understanding of the suicide prevention education information provided.  The family member/significant other agrees to remove the items of safety concern listed above.  Jeff Stevenson N Smart LCSW 03/07/2017, 9:26 AM

## 2017-03-08 NOTE — Progress Notes (Signed)
Late entry: I reviewed labs with patient's mother. AMA and ASMA are positive. Iron studies elevated in setting of chronic Hep C. We are not pursuing any further evaluation for him. Please send to PCP. I am not sure that the [facility] in [place] will be seeing him, but he needs follow-up for these abnormalities with a GI specialist. It is very important that he gets established with another GI practice that is willing to see him.

## 2017-04-08 DIAGNOSIS — H00015 Hordeolum externum left lower eyelid: Secondary | ICD-10-CM | POA: Diagnosis not present

## 2017-05-16 DIAGNOSIS — I10 Essential (primary) hypertension: Secondary | ICD-10-CM | POA: Diagnosis not present

## 2017-05-16 DIAGNOSIS — J449 Chronic obstructive pulmonary disease, unspecified: Secondary | ICD-10-CM | POA: Diagnosis not present

## 2017-05-16 DIAGNOSIS — B182 Chronic viral hepatitis C: Secondary | ICD-10-CM | POA: Diagnosis not present

## 2017-05-16 DIAGNOSIS — F1011 Alcohol abuse, in remission: Secondary | ICD-10-CM | POA: Diagnosis not present

## 2017-08-15 DIAGNOSIS — F3340 Major depressive disorder, recurrent, in remission, unspecified: Secondary | ICD-10-CM | POA: Diagnosis not present

## 2017-08-15 DIAGNOSIS — F449 Dissociative and conversion disorder, unspecified: Secondary | ICD-10-CM | POA: Diagnosis not present

## 2017-08-15 DIAGNOSIS — B182 Chronic viral hepatitis C: Secondary | ICD-10-CM | POA: Diagnosis not present

## 2017-08-15 DIAGNOSIS — J449 Chronic obstructive pulmonary disease, unspecified: Secondary | ICD-10-CM | POA: Diagnosis not present

## 2017-08-15 DIAGNOSIS — Z114 Encounter for screening for human immunodeficiency virus [HIV]: Secondary | ICD-10-CM | POA: Diagnosis not present

## 2017-08-15 DIAGNOSIS — F17210 Nicotine dependence, cigarettes, uncomplicated: Secondary | ICD-10-CM | POA: Diagnosis not present

## 2017-08-15 DIAGNOSIS — Z89512 Acquired absence of left leg below knee: Secondary | ICD-10-CM | POA: Diagnosis not present

## 2017-08-15 DIAGNOSIS — Z1389 Encounter for screening for other disorder: Secondary | ICD-10-CM | POA: Diagnosis not present

## 2017-08-15 DIAGNOSIS — Z0000 Encounter for general adult medical examination without abnormal findings: Secondary | ICD-10-CM | POA: Diagnosis not present

## 2017-08-31 DIAGNOSIS — I10 Essential (primary) hypertension: Secondary | ICD-10-CM | POA: Diagnosis not present

## 2017-08-31 DIAGNOSIS — J441 Chronic obstructive pulmonary disease with (acute) exacerbation: Secondary | ICD-10-CM | POA: Diagnosis not present

## 2017-08-31 DIAGNOSIS — B182 Chronic viral hepatitis C: Secondary | ICD-10-CM | POA: Diagnosis not present

## 2017-09-09 ENCOUNTER — Other Ambulatory Visit (HOSPITAL_COMMUNITY): Payer: Self-pay | Admitting: Nurse Practitioner

## 2017-09-09 DIAGNOSIS — B182 Chronic viral hepatitis C: Secondary | ICD-10-CM | POA: Diagnosis not present

## 2017-09-12 ENCOUNTER — Other Ambulatory Visit: Payer: Self-pay | Admitting: Nurse Practitioner

## 2017-09-12 DIAGNOSIS — K7400 Hepatic fibrosis, unspecified: Secondary | ICD-10-CM

## 2017-09-12 DIAGNOSIS — B182 Chronic viral hepatitis C: Secondary | ICD-10-CM

## 2017-09-16 ENCOUNTER — Other Ambulatory Visit (HOSPITAL_COMMUNITY): Payer: Self-pay | Admitting: Nurse Practitioner

## 2017-09-16 DIAGNOSIS — K7400 Hepatic fibrosis, unspecified: Secondary | ICD-10-CM

## 2017-09-16 DIAGNOSIS — B182 Chronic viral hepatitis C: Secondary | ICD-10-CM

## 2017-09-19 ENCOUNTER — Other Ambulatory Visit: Payer: Self-pay | Admitting: Radiology

## 2017-09-21 ENCOUNTER — Ambulatory Visit (HOSPITAL_COMMUNITY)
Admission: RE | Admit: 2017-09-21 | Discharge: 2017-09-21 | Disposition: A | Discharge status: Home / Self Care | Payer: Medicare Other | Source: Ambulatory Visit | Attending: Nurse Practitioner | Admitting: Nurse Practitioner

## 2017-09-21 ENCOUNTER — Encounter (HOSPITAL_COMMUNITY): Payer: Self-pay

## 2017-09-21 DIAGNOSIS — K7400 Hepatic fibrosis, unspecified: Secondary | ICD-10-CM

## 2017-09-21 DIAGNOSIS — N281 Cyst of kidney, acquired: Secondary | ICD-10-CM | POA: Insufficient documentation

## 2017-09-21 DIAGNOSIS — B182 Chronic viral hepatitis C: Secondary | ICD-10-CM | POA: Insufficient documentation

## 2017-09-21 DIAGNOSIS — K760 Fatty (change of) liver, not elsewhere classified: Secondary | ICD-10-CM | POA: Diagnosis not present

## 2017-09-21 DIAGNOSIS — I700 Atherosclerosis of aorta: Secondary | ICD-10-CM | POA: Diagnosis not present

## 2017-09-21 DIAGNOSIS — B1920 Unspecified viral hepatitis C without hepatic coma: Secondary | ICD-10-CM | POA: Diagnosis not present

## 2017-09-21 DIAGNOSIS — R740 Nonspecific elevation of levels of transaminase and lactic acid dehydrogenase [LDH]: Secondary | ICD-10-CM | POA: Diagnosis not present

## 2017-09-21 LAB — CBC
HCT: 53.7 % — ABNORMAL HIGH (ref 39.0–52.0)
Hemoglobin: 18.7 g/dL — ABNORMAL HIGH (ref 13.0–17.0)
MCH: 33 pg (ref 26.0–34.0)
MCHC: 34.8 g/dL (ref 30.0–36.0)
MCV: 94.7 fL (ref 78.0–100.0)
Platelets: 192 10*3/uL (ref 150–400)
RBC: 5.67 MIL/uL (ref 4.22–5.81)
RDW: 12.3 % (ref 11.5–15.5)
WBC: 13.2 10*3/uL — ABNORMAL HIGH (ref 4.0–10.5)

## 2017-09-21 LAB — APTT: aPTT: 30 seconds (ref 24–36)

## 2017-09-21 LAB — PROTIME-INR
INR: 0.95
Prothrombin Time: 12.6 seconds (ref 11.4–15.2)

## 2017-09-21 MED ORDER — GELATIN ABSORBABLE 12-7 MM EX MISC
CUTANEOUS | Status: AC
  Filled 2017-09-21: qty 1

## 2017-09-21 MED ORDER — FENTANYL CITRATE (PF) 100 MCG/2ML IJ SOLN
INTRAMUSCULAR | Status: AC
  Filled 2017-09-21: qty 2

## 2017-09-21 MED ORDER — LIDOCAINE HCL (PF) 1 % IJ SOLN
INTRAMUSCULAR | Status: AC
  Filled 2017-09-21: qty 30

## 2017-09-21 MED ORDER — FENTANYL CITRATE (PF) 100 MCG/2ML IJ SOLN
INTRAMUSCULAR | Status: AC | PRN
  Administered 2017-09-21 (×2): 25 ug via INTRAVENOUS
  Administered 2017-09-21: 50 ug via INTRAVENOUS

## 2017-09-21 MED ORDER — MIDAZOLAM HCL 2 MG/2ML IJ SOLN
INTRAMUSCULAR | Status: AC | PRN
  Administered 2017-09-21 (×2): 0.5 mg via INTRAVENOUS
  Administered 2017-09-21: 1 mg via INTRAVENOUS

## 2017-09-21 MED ORDER — MIDAZOLAM HCL 2 MG/2ML IJ SOLN
INTRAMUSCULAR | Status: AC
  Filled 2017-09-21: qty 2

## 2017-09-21 MED ORDER — SODIUM CHLORIDE 0.9 % IV SOLN: INTRAVENOUS | Status: DC

## 2017-09-21 NOTE — Discharge Instructions (Signed)
Moderate Conscious Sedation, Adult, Care After °These instructions provide you with information about caring for yourself after your procedure. Your health care provider may also give you more specific instructions. Your treatment has been planned according to current medical practices, but problems sometimes occur. Call your health care provider if you have any problems or questions after your procedure. °What can I expect after the procedure? °After your procedure, it is common: °· To feel sleepy for several hours. °· To feel clumsy and have poor balance for several hours. °· To have poor judgment for several hours. °· To vomit if you eat too soon. ° °Follow these instructions at home: °For at least 24 hours after the procedure: ° °· Do not: °? Participate in activities where you could fall or become injured. °? Drive. °? Use heavy machinery. °? Drink alcohol. °? Take sleeping pills or medicines that cause drowsiness. °? Make important decisions or sign legal documents. °? Take care of children on your own. °· Rest. °Eating and drinking °· Follow the diet recommended by your health care provider. °· If you vomit: °? Drink water, juice, or soup when you can drink without vomiting. °? Make sure you have little or no nausea before eating solid foods. °General instructions °· Have a responsible adult stay with you until you are awake and alert. °· Take over-the-counter and prescription medicines only as told by your health care provider. °· If you smoke, do not smoke without supervision. °· Keep all follow-up visits as told by your health care provider. This is important. °Contact a health care provider if: °· You keep feeling nauseous or you keep vomiting. °· You feel light-headed. °· You develop a rash. °· You have a fever. °Get help right away if: °· You have trouble breathing. °This information is not intended to replace advice given to you by your health care provider. Make sure you discuss any questions you have  with your health care provider. °Document Released: 06/27/2013 Document Revised: 02/09/2016 Document Reviewed: 12/27/2015 °Elsevier Interactive Patient Education © 2018 [employer] ° ° °Liver Biopsy, Care After °These instructions give you information on caring for yourself after your procedure. Your doctor may also give you more specific instructions. Call your doctor if you have any problems or questions after your procedure. °Follow these instructions at home: °· Rest at home for 1-2 days or as told by your doctor. °· Have someone stay with you for at least 24 hours. °· Do not do these things in the first 24 hours: °? Drive. °? Use machinery. °? Take care of other people. °? Sign legal documents. °? Take a bath or shower. °· There are many different ways to close and cover a cut (incision). For example, a cut can be closed with stitches, skin glue, or adhesive strips. Follow your doctor's instructions on: °? Taking care of your cut. °? Changing and removing your bandage (dressing). °? Removing whatever was used to close your cut. °· Do not drink alcohol in the first week. °· Do not lift more than 5 pounds or play contact sports for the first 2 weeks. °· Take medicines only as told by your doctor. For 1 week, do not take medicine that has aspirin in it or medicines like ibuprofen. °· Get your test results. °Contact a doctor if: °· A cut bleeds and leaves more than just a small spot of blood. °· A cut is red, puffs up (swells), or hurts more than before. °· Fluid or something else   comes from a cut. °· A cut smells bad. °· You have a fever or chills. °Get help right away if: °· You have swelling, bloating, or pain in your belly (abdomen). °· You get dizzy or faint. °· You have a rash. °· You feel sick to your stomach (nauseous) or throw up (vomit). °· You have trouble breathing, feel short of breath, or feel faint. °· Your chest hurts. °· You have problems talking or seeing. °· You have trouble balancing or moving  your arms or legs. °This information is not intended to replace advice given to you by your health care provider. Make sure you discuss any questions you have with your health care provider. °Document Released: 06/15/2008 Document Revised: 02/12/2016 Document Reviewed: 11/02/2013 °Elsevier Interactive Patient Education © 2018 [employer] ° ° °

## 2017-09-21 NOTE — Discharge Instructions (Addendum)
Liver Biopsy, Care After °Refer to this sheet in the next few weeks. These instructions provide you with information on caring for yourself after your procedure. Your health care provider may also give you more specific instructions. Your treatment has been planned according to current medical practices, but problems sometimes occur. Call your health care provider if you have any problems or questions after your procedure. °What can I expect after the procedure? °After your procedure, it is typical to have the following: °· A small amount of discomfort in the area where the biopsy was done and in the right shoulder or shoulder blade. °· A small amount of bruising around the area where the biopsy was done and on the skin over the liver. °· Sleepiness and fatigue for the rest of the day. ° °Follow these instructions at home: °· Rest at home for 1-2 days or as directed by your health care provider. °· Have a friend or family member stay with you for at least 24 hours. °· Because of the medicines used during the procedure, you should not do the following things in the first 24 hours: °? Drive. °? Use machinery. °? Be responsible for the care of other people. °? Sign legal documents. °? Take a bath or shower. °· There are many different ways to close and cover an incision, including stitches, skin glue, and adhesive strips. Follow your health care provider's instructions on: °? Incision care. °? Bandage (dressing) changes and removal. °? Incision closure removal. °· Do not drink alcohol in the first week. °· Do not lift more than 5 pounds or play contact sports for 2 weeks after this test. °· Take medicines only as directed by your health care provider. Do not take medicine containing aspirin or non-steroidal anti-inflammatory medicines such as ibuprofen for 1 week after this test. °· It is your responsibility to get your test results. °Contact a health care provider if: °· You have increased bleeding from an incision  that results in more than a small spot of blood. °· You have redness, swelling, or increasing pain in any incisions. °· You notice a discharge or a bad smell coming from any of your incisions. °· You have a fever or chills. °Get help right away if: °· You develop swelling, bloating, or pain in your abdomen. °· You become dizzy or faint. °· You develop a rash. °· You are nauseous or vomit. °· You have difficulty breathing, feel short of breath, or feel faint. °· You develop chest pain. °· You have problems with your speech or vision. °· You have trouble balancing or moving your arms or legs. °This information is not intended to replace advice given to you by your health care provider. Make sure you discuss any questions you have with your health care provider. °Document Released: 03/26/2005 Document Revised: 02/12/2016 Document Reviewed: 11/02/2013 °Elsevier Interactive Patient Education © 2018 Elsevier Inc[employer] °Moderate Conscious Sedation, Adult, Care After °These instructions provide you with information about caring for yourself after your procedure. Your health care provider may also give you more specific instructions. Your treatment has been planned according to current medical practices, but problems sometimes occur. Call your health care provider if you have any problems or questions after your procedure. °What can I expect after the procedure? °After your procedure, it is common: °· To feel sleepy for several hours. °· To feel clumsy and have poor balance for several hours. °· To have poor judgment for several hours. °· To vomit if you eat   too soon. ° °Follow these instructions at home: °For at least 24 hours after the procedure: ° °· Do not: °? Participate in activities where you could fall or become injured. °? Drive. °? Use heavy machinery. °? Drink alcohol. °? Take sleeping pills or medicines that cause drowsiness. °? Make important decisions or sign legal documents. °? Take care of children on your  own. °· Rest. °Eating and drinking °· Follow the diet recommended by your health care provider. °· If you vomit: °? Drink water, juice, or soup when you can drink without vomiting. °? Make sure you have little or no nausea before eating solid foods. °General instructions °· Have a responsible adult stay with you until you are awake and alert. °· Take over-the-counter and prescription medicines only as told by your health care provider. °· If you smoke, do not smoke without supervision. °· Keep all follow-up visits as told by your health care provider. This is important. °Contact a health care provider if: °· You keep feeling nauseous or you keep vomiting. °· You feel light-headed. °· You develop a rash. °· You have a fever. °Get help right away if: °· You have trouble breathing. °This information is not intended to replace advice given to you by your health care provider. Make sure you discuss any questions you have with your health care provider. °Document Released: 06/27/2013 Document Revised: 02/09/2016 Document Reviewed: 12/27/2015 °Elsevier Interactive Patient Education © 2018 Elsevier Inc. ° °

## 2017-09-21 NOTE — H&P (Signed)
Chief Complaint: Patient was seen in consultation today for liver core biopsy at the request of Drazek,Dawn  Referring Physician(s): Drazek,Dawn  Supervising Physician: Sandi Mariscal  Patient Status: [facility] - Out-pt  History of Present Illness: Jeff Stevenson is a 57 y.o. male   Hep C HTN Elevated liver functions Pos AMA; ASMA Scheduled for liver core biopsy per Roosevelt Locks    Past Medical History:  Diagnosis Date  . ETOH abuse   . Hepatitis C   . Psychosis [facility])     Past Surgical History:  Procedure Laterality Date  . BELOW KNEE LEG AMPUTATION    . broke his back    . CHEST TUBE INSERTION    . GASTROSTOMY W/ FEEDING TUBE     and removal  . SPLENECTOMY    . TRACHEOSTOMY     and removal     Allergies: Patient has no known allergies.  Medications: Prior to Admission medications   Medication Sig Start Date End Date Taking? Authorizing Provider  lisinopril-hydrochlorothiazide (PRINZIDE,ZESTORETIC) 10-12.5 MG tablet Take 1 tablet by mouth daily. For high blood pressure 03/07/17  Yes Nwoko, Herbert Pun I, NP  SYMBICORT 160-4.5 MCG/ACT inhaler Inhale 2 puffs into the lungs 2 (two) times daily. For Asthma 03/07/17  Yes Lindell Spar I, NP  traZODone (DESYREL) 50 MG tablet Take 1 tablet (50 mg total) by mouth at bedtime as needed for sleep. 03/07/17  Yes Nwoko, Herbert Pun I, NP  VENTOLIN HFA 108 (90 Base) MCG/ACT inhaler Inhale 1 puff into the lungs every 4 (four) hours as needed for wheezing or shortness of breath. 03/07/17  Yes Lindell Spar I, NP  hydrOXYzine (ATARAX/VISTARIL) 25 MG tablet Take 1 tablet (25 mg) four times daily as needed: For anxiety Patient not taking: Reported on 09/14/2017 03/07/17   Lindell Spar I, NP     Family History  Problem Relation Age of Onset  . Breast cancer Mother   . Colon cancer Neg Hx     Social History   Socioeconomic History  . Marital status: Divorced    Spouse name: None  . Number of children: None  . Years of education: None  .  Highest education level: None  Social Needs  . Financial resource strain: None  . Food insecurity - worry: None  . Food insecurity - inability: None  . Transportation needs - medical: None  . Transportation needs - non-medical: None  Occupational History  . None  Tobacco Use  . Smoking status: Current Every Day Smoker    Packs/day: 1.00  . Smokeless tobacco: Never Used  Substance and Sexual Activity  . Alcohol use: Yes    Comment: 4- 40 OZ DAILY  . Drug use: Yes    Types: Marijuana    Comment: history of intranasal cocaine, no IV drug use, none currently   . Sexual activity: None  Other Topics Concern  . None  Social History Narrative  . None    Review of Systems: A 12 point ROS discussed and pertinent positives are indicated in the HPI above.  All other systems are negative.  Review of Systems  Constitutional: Negative for activity change, fatigue and fever.  Respiratory: Negative for cough.   Cardiovascular: Negative for chest pain.  Gastrointestinal: Negative for abdominal pain.  Neurological: Negative for weakness.  Psychiatric/Behavioral: Negative for behavioral problems and confusion.    Vital Signs: BP (!) 191/158 Comment: pt just took bp med 15 minutes ago  Pulse 72   Temp 98.4 F (36.9 C) (  Oral)   Resp 18   Ht 5\' 11"  (1.803 m)   Wt 155 lb (70.3 kg)   SpO2 98%   BMI 21.62 kg/m   Physical Exam  Constitutional: He is oriented to person, place, and time.  Cardiovascular: Normal rate, regular rhythm and normal heart sounds.  Pulmonary/Chest: Effort normal and breath sounds normal.  Abdominal: Soft. Bowel sounds are normal.  Musculoskeletal: Normal range of motion.  Neurological: He is alert and oriented to person, place, and time.  Skin: Skin is warm and dry.  Psychiatric: He has a normal mood and affect. His behavior is normal. Judgment and thought content normal.  Nursing note and vitals reviewed.   Imaging: No results  found.  Labs:  CBC: Recent Labs    11/10/16 1105 01/31/17 0932 03/04/17 1541  WBC 9.3 6.5 8.3  HGB 18.7* 16.9 17.7*  HCT 53.1* 48.3 50.4  PLT 157 121* 158    COAGS: Recent Labs    10/01/16 1134 01/31/17 0932  INR 1.0 1.0    BMP: Recent Labs    10/01/16 1134 11/10/16 1105 01/31/17 0932 03/04/17 1541  NA 134* 132* 136 136  K 4.4 4.0 4.2 3.8  CL 100 101 103 100*  CO2 25 23 24  21*  GLUCOSE 92 94 96 92  BUN 9 9 6* 5*  CALCIUM 9.3 9.0 9.0 8.5*  CREATININE 0.67* 0.68 0.49* 0.56*  GFRNONAA >89 >60 >89 >60  GFRAA >89 >60 >89 >60    LIVER FUNCTION TESTS: Recent Labs    10/01/16 1134 01/31/17 0932 03/04/17 1541  BILITOT 0.8 0.9 0.8  AST 85* 180* 123*  ALT 101* 131* 100*  ALKPHOS 114 115 108  PROT 8.0 7.8 8.2*  ALBUMIN 3.8 4.0 4.0    TUMOR MARKERS: No results for input(s): AFPTM, CEA, CA199, CHROMGRNA in the last 8760 hours.  Assessment and Plan:  Hep C Elevated liver functions Scheduled for liver core biopsy Risks and benefits discussed with the patient including, but not limited to bleeding, infection, damage to adjacent structures or low yield requiring additional tests. All of the patient's questions were answered, patient is agreeable to proceed. Consent signed and in chart.   Thank you for this interesting consult.  I greatly enjoyed meeting Jeff Stevenson and look forward to participating in their care.  A copy of this report was sent to the requesting provider on this date.  Electronically Signed: Lavonia Drafts, PA-C 09/21/2017, 11:59 AM   I spent a total of  30 Minutes   in face to face in clinical consultation, greater than 50% of which was counseling/coordinating care for liver core biopsy

## 2017-09-21 NOTE — Procedures (Signed)
Interventional Radiology Procedure Note  Procedure: US guided medical liver biopsy.  Right liver.  .  Complications: None  Recommendations:  - Ok to shower tomorrow - Do not submerge for 7 days - Routine wound care  - advance diet - 2 hour recovery   Signed,  Dulcy Fanny. Earleen Newport, DO

## 2017-10-20 ENCOUNTER — Ambulatory Visit (INDEPENDENT_AMBULATORY_CARE_PROVIDER_SITE_OTHER): Payer: Medicare Other | Admitting: Orthopaedic Surgery

## 2017-10-20 ENCOUNTER — Encounter: Payer: Self-pay | Admitting: Orthopaedic Surgery

## 2017-10-20 VITALS — BP 169/109 | HR 97 | Ht 71.0 in | Wt 145.0 lb

## 2017-10-20 DIAGNOSIS — Z89512 Acquired absence of left leg below knee: Secondary | ICD-10-CM

## 2017-10-20 NOTE — Patient Instructions (Signed)
Steps to Quit Smoking Smoking tobacco can be bad for your health. It can also affect almost every organ in your body. Smoking puts you and people around you at risk for many serious long-lasting (chronic) diseases. Quitting smoking is hard, but it is one of the best things that you can do for your health. It is never too late to quit. What are the benefits of quitting smoking? When you quit smoking, you lower your risk for getting serious diseases and conditions. They can include:  Lung cancer or lung disease.  Heart disease.  Stroke.  Heart attack.  Not being able to have children (infertility).  Weak bones (osteoporosis) and broken bones (fractures).  If you have coughing, wheezing, and shortness of breath, those symptoms may get better when you quit. You may also get sick less often. If you are pregnant, quitting smoking can help to lower your chances of having a baby of low birth weight. What can I do to help me quit smoking? Talk with your doctor about what can help you quit smoking. Some things you can do (strategies) include:  Quitting smoking totally, instead of slowly cutting back how much you smoke over a period of time.  Going to in-person counseling. You are more likely to quit if you go to many counseling sessions.  Using resources and support systems, such as: ? Online chats with a counselor. ? Phone quitlines. ? Printed self-help materials. ? Support groups or group counseling. ? Text messaging programs. ? Mobile phone apps or applications.  Taking medicines. Some of these medicines may have nicotine in them. If you are pregnant or breastfeeding, do not take any medicines to quit smoking unless your doctor says it is okay. Talk with your doctor about counseling or other things that can help you.  Talk with your doctor about using more than one strategy at the same time, such as taking medicines while you are also going to in-person counseling. This can help make  quitting easier. What things can I do to make it easier to quit? Quitting smoking might feel very hard at first, but there is a lot that you can do to make it easier. Take these steps:  Talk to your family and friends. Ask them to support and encourage you.  Call phone quitlines, reach out to support groups, or work with a counselor.  Ask people who smoke to not smoke around you.  Avoid places that make you want (trigger) to smoke, such as: ? Bars. ? Parties. ? Smoke-break areas at work.  Spend time with people who do not smoke.  Lower the stress in your life. Stress can make you want to smoke. Try these things to help your stress: ? Getting regular exercise. ? Deep-breathing exercises. ? Yoga. ? Meditating. ? Doing a body scan. To do this, close your eyes, focus on one area of your body at a time from head to toe, and notice which parts of your body are tense. Try to relax the muscles in those areas.  Download or buy apps on your mobile phone or tablet that can help you stick to your quit plan. There are many free apps, such as QuitGuide from the CDC (Centers for Disease Control and Prevention). You can find more support from smokefree.gov and other websites.  This information is not intended to replace advice given to you by your health care provider. Make sure you discuss any questions you have with your health care provider. Document Released: 07/03/2009 Document   Revised: 05/04/2016 Document Reviewed: 01/21/2015 Elsevier Interactive Patient Education  2018 Elsevier Inc.  

## 2017-10-20 NOTE — Progress Notes (Signed)
Subjective:    Patient ID: Jeff Stevenson, male    DOB: Nov 12, 1960, 57 y.o.   MRN: [id number]  HPI He has had below knee amputation years ago.  He has need for repair/replacement of the left below knee prosthesis and he needs new socks.  He has no problems with his stump.  He has no new trauma.  He has been told to come here for the necessary orders and referrals.   Review of Systems  Musculoskeletal: Positive for arthralgias and gait problem.  All other systems reviewed and are negative.  Past Medical History:  Diagnosis Date  . ETOH abuse   . Hepatitis C   . Psychosis [facility])     Past Surgical History:  Procedure Laterality Date  . BELOW KNEE LEG AMPUTATION    . broke his back    . CHEST TUBE INSERTION    . GASTROSTOMY W/ FEEDING TUBE     and removal  . SPLENECTOMY    . TRACHEOSTOMY     and removal     Current Outpatient Medications on File Prior to Visit  Medication Sig Dispense Refill  . lisinopril-hydrochlorothiazide (PRINZIDE,ZESTORETIC) 10-12.5 MG tablet Take 1 tablet by mouth daily. For high blood pressure 30 tablet 0  . SYMBICORT 160-4.5 MCG/ACT inhaler Inhale 2 puffs into the lungs 2 (two) times daily. For Asthma 1 Inhaler 0  . traZODone (DESYREL) 50 MG tablet Take 1 tablet (50 mg total) by mouth at bedtime as needed for sleep. 30 tablet 0  . VENTOLIN HFA 108 (90 Base) MCG/ACT inhaler Inhale 1 puff into the lungs every 4 (four) hours as needed for wheezing or shortness of breath.    . hydrOXYzine (ATARAX/VISTARIL) 25 MG tablet Take 1 tablet (25 mg) four times daily as needed: For anxiety (Patient not taking: Reported on 09/14/2017) 60 tablet 0   No current facility-administered medications on file prior to visit.     Social History   Socioeconomic History  . Marital status: Divorced    Spouse name: Not on file  . Number of children: Not on file  . Years of education: Not on file  . Highest education level: Not on file  Social Needs  . Financial resource  strain: Not on file  . Food insecurity - worry: Not on file  . Food insecurity - inability: Not on file  . Transportation needs - medical: Not on file  . Transportation needs - non-medical: Not on file  Occupational History  . Not on file  Tobacco Use  . Smoking status: Current Every Day Smoker    Packs/day: 1.00  . Smokeless tobacco: Never Used  Substance and Sexual Activity  . Alcohol use: Yes    Comment: 4- 40 OZ DAILY  . Drug use: Yes    Types: Marijuana    Comment: history of intranasal cocaine, no IV drug use, none currently   . Sexual activity: Not on file  Other Topics Concern  . Not on file  Social History Narrative  . Not on file    Family History  Problem Relation Age of Onset  . Breast cancer Mother   . Colon cancer Neg Hx     BP (!) 169/109   Pulse 97   Ht 5\' 11"  (1.803 m)   Wt 145 lb (65.8 kg)   BMI 20.22 kg/m      Objective:   Physical Exam  Constitutional: He is oriented to person, place, and time. He appears well-developed and well-nourished.  HENT:  Head: Normocephalic and atraumatic.  Eyes: Conjunctivae and EOM are normal. Pupils are equal, round, and reactive to light.  Neck: Normal range of motion. Neck supple.  Cardiovascular: Normal rate, regular rhythm and intact distal pulses.  Pulmonary/Chest: Effort normal.  Abdominal: Soft.  Musculoskeletal: He exhibits deformity (Left below knee amputation, stump OK, prosthesis has looseness and appears to have loose rivet, limp left).  Neurological: He is alert and oriented to person, place, and time. He has normal reflexes. He displays normal reflexes. No cranial nerve deficit. He exhibits normal muscle tone. Coordination normal.  Skin: Skin is warm and dry.  Psychiatric: He has a normal mood and affect. His behavior is normal. Judgment and thought content normal.  Vitals reviewed.         Assessment & Plan:   Encounter Diagnosis  Name Primary?  . Status post below knee amputation of left  lower extremity ([place]) Yes   I have given Rx for stump socks.  I have given Rx for repair/replace prosthesis.  I will see him as needed.  Call if any problem.  Precautions discussed.   Electronically Signed Sanjuana Kava, MD 1/31/20198:27 AM

## 2017-11-14 DIAGNOSIS — I10 Essential (primary) hypertension: Secondary | ICD-10-CM | POA: Diagnosis not present

## 2017-11-14 DIAGNOSIS — K709 Alcoholic liver disease, unspecified: Secondary | ICD-10-CM | POA: Diagnosis not present

## 2017-11-14 DIAGNOSIS — Z89512 Acquired absence of left leg below knee: Secondary | ICD-10-CM | POA: Diagnosis not present

## 2017-11-14 DIAGNOSIS — B182 Chronic viral hepatitis C: Secondary | ICD-10-CM | POA: Diagnosis not present

## 2017-11-16 DIAGNOSIS — B182 Chronic viral hepatitis C: Secondary | ICD-10-CM | POA: Diagnosis not present

## 2017-11-16 DIAGNOSIS — K740 Hepatic fibrosis: Secondary | ICD-10-CM | POA: Diagnosis not present

## 2017-12-21 DIAGNOSIS — B182 Chronic viral hepatitis C: Secondary | ICD-10-CM | POA: Diagnosis not present

## 2018-01-18 DIAGNOSIS — B182 Chronic viral hepatitis C: Secondary | ICD-10-CM | POA: Diagnosis not present

## 2018-01-26 DIAGNOSIS — I10 Essential (primary) hypertension: Secondary | ICD-10-CM | POA: Diagnosis not present

## 2018-01-26 DIAGNOSIS — F17218 Nicotine dependence, cigarettes, with other nicotine-induced disorders: Secondary | ICD-10-CM | POA: Diagnosis not present

## 2018-01-26 DIAGNOSIS — J449 Chronic obstructive pulmonary disease, unspecified: Secondary | ICD-10-CM | POA: Diagnosis not present

## 2018-01-26 DIAGNOSIS — Z89512 Acquired absence of left leg below knee: Secondary | ICD-10-CM | POA: Diagnosis not present

## 2018-04-13 DIAGNOSIS — B182 Chronic viral hepatitis C: Secondary | ICD-10-CM | POA: Diagnosis not present

## 2018-04-19 DIAGNOSIS — K740 Hepatic fibrosis: Secondary | ICD-10-CM | POA: Diagnosis not present

## 2018-04-19 DIAGNOSIS — B182 Chronic viral hepatitis C: Secondary | ICD-10-CM | POA: Diagnosis not present

## 2018-05-05 DIAGNOSIS — K7460 Unspecified cirrhosis of liver: Secondary | ICD-10-CM | POA: Diagnosis not present

## 2018-05-05 DIAGNOSIS — F17210 Nicotine dependence, cigarettes, uncomplicated: Secondary | ICD-10-CM | POA: Diagnosis not present

## 2018-05-05 DIAGNOSIS — F17200 Nicotine dependence, unspecified, uncomplicated: Secondary | ICD-10-CM | POA: Diagnosis not present

## 2018-05-05 DIAGNOSIS — J449 Chronic obstructive pulmonary disease, unspecified: Secondary | ICD-10-CM | POA: Diagnosis not present

## 2018-05-05 DIAGNOSIS — I10 Essential (primary) hypertension: Secondary | ICD-10-CM | POA: Diagnosis not present

## 2018-09-02 IMAGING — US US ABDOMEN COMPLETE W/ ELASTOGRAPHY
2 series · 12 of 25 positions shown · non-contrast
Comparison: None.

CLINICAL DATA: Elevated liver function tests. Recently diagnosed
hepatitis-C.



[Series 1: us abdomen complete w/ elastography · 0.18mm/px · 11 of 99 slices shown (1 of 2)]
[im 5/99]
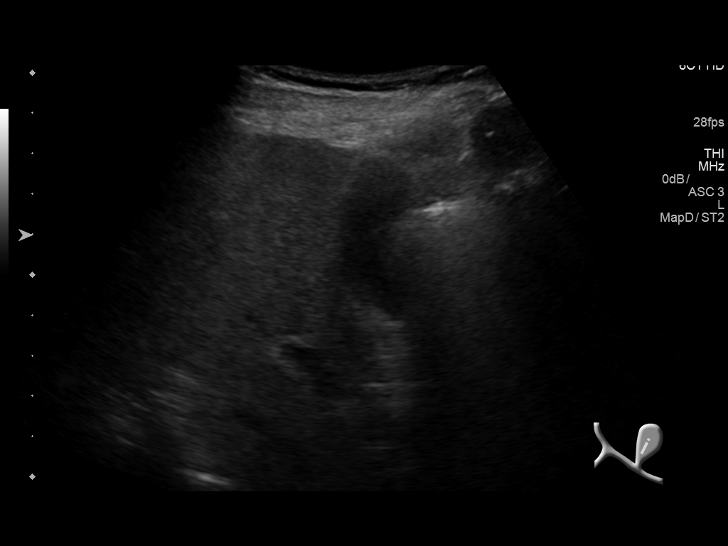
[im 15/99]
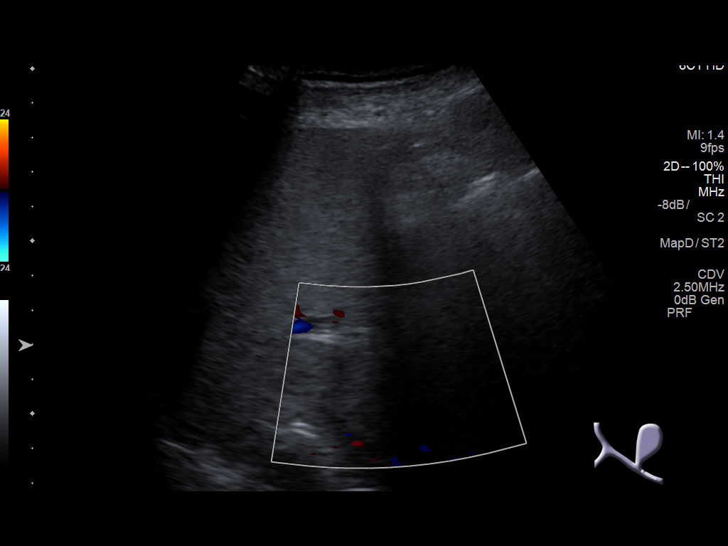
[im 24/99]
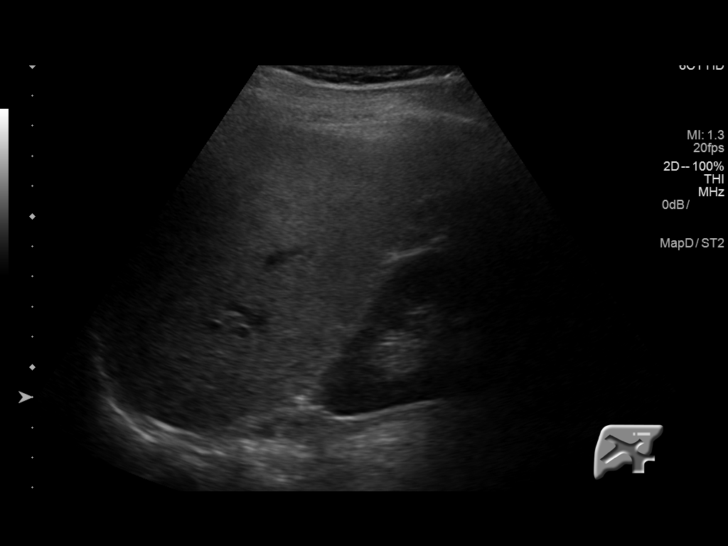
[im 33/99]
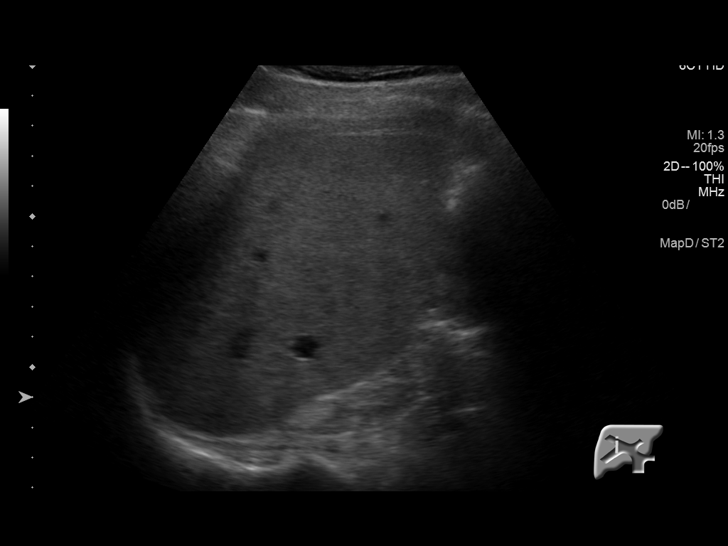
[im 43/99]
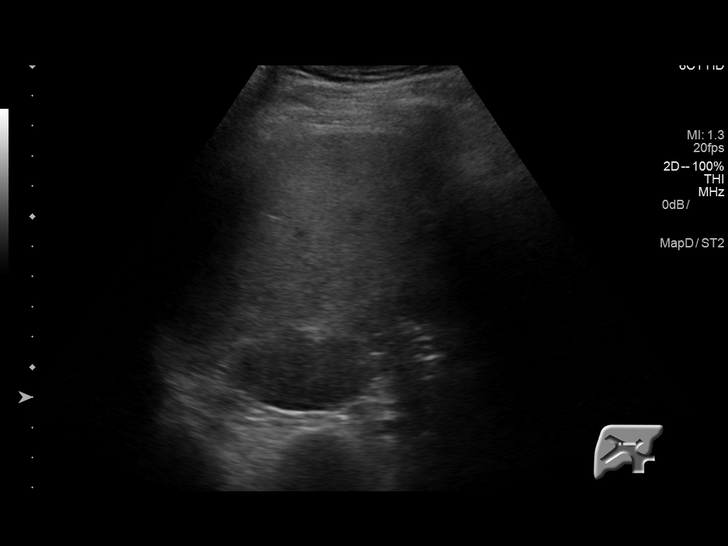
[im 52/99]
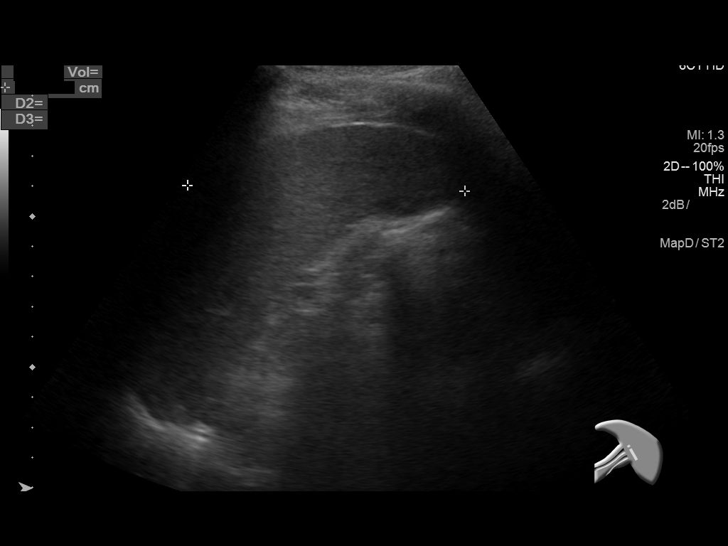
[im 61/99]
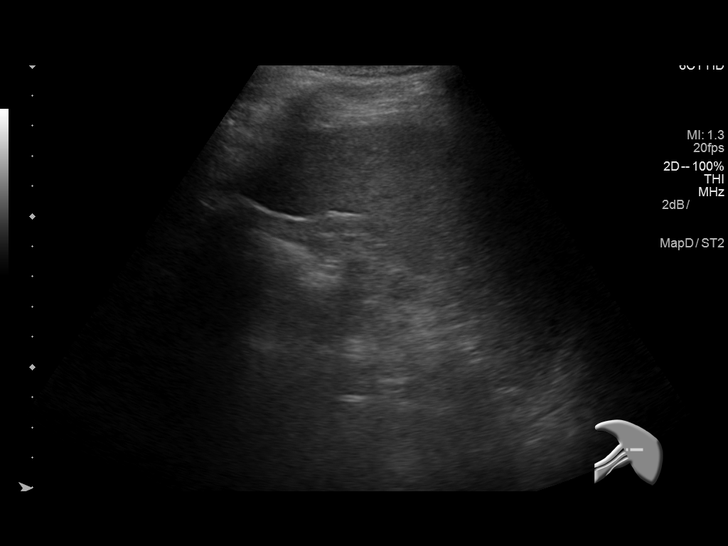
[im 71/99]
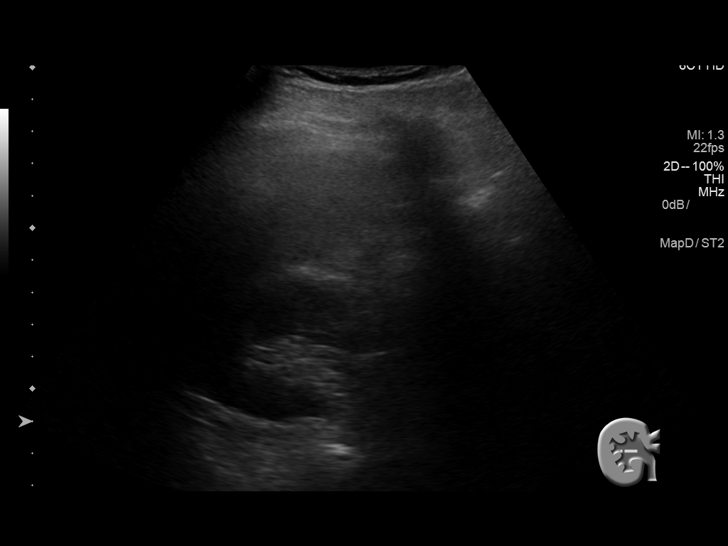
[im 80/99]
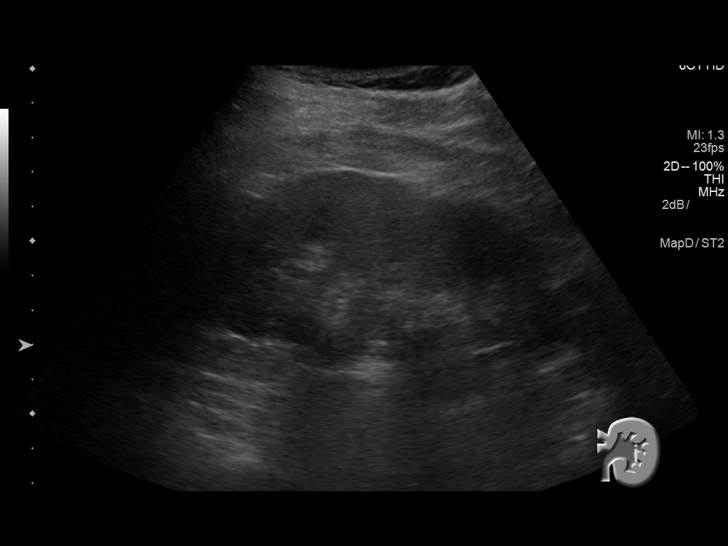
[im 89/99]
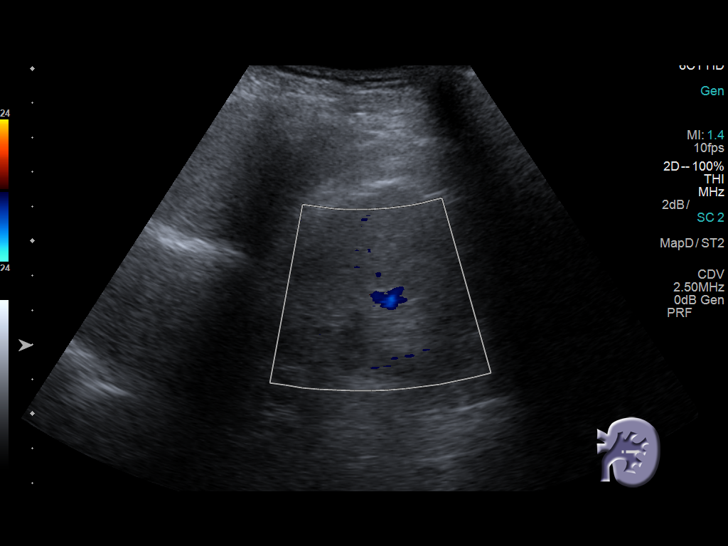
[im 99/99]
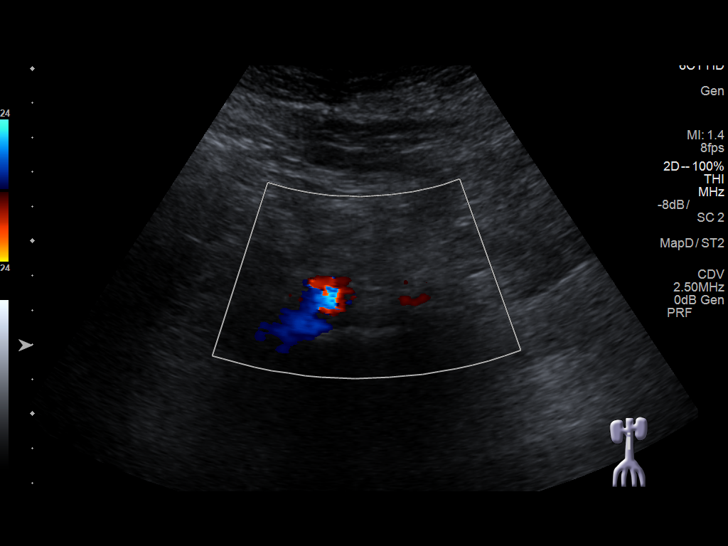

[Series 2001: us abdomen complete w/ elastography · 0.12mm/px · 1 of 14 slices shown (2 of 2)]
[im 7/14]
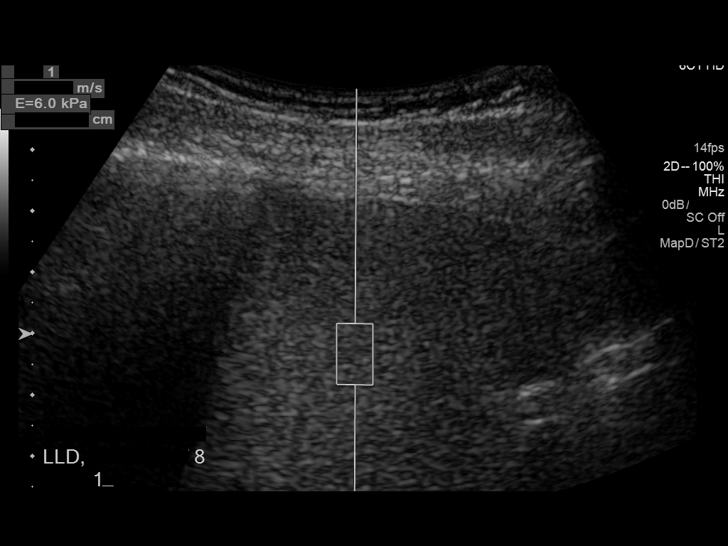

[12 of 25 positions shown; findings below may reference images not displayed]

FINDINGS: ULTRASOUND ABDOMEN

Gallbladder: No gallstones or wall thickening visualized. No
sonographic Murphy sign noted by sonographer.

Common bile duct: Diameter: 3 mm, within normal limits.

Liver: Diffusely increased echogenicity of the hepatic parenchyma,
consistent with hepatic steatosis. No focal mass lesion identified.

IVC: No abnormality visualized.

Pancreas: Visualized portion unremarkable.

Spleen: Spleen near upper limits of normal in size, measuring
approximately 12 cm in length. No splenic lesions identified.

Right Kidney: Length: 11.8 cm. Echogenicity within normal limits. No
mass or hydronephrosis visualized.

Left Kidney: Length: 12.0 cm. Echogenicity within normal limits. No
mass or hydronephrosis visualized.

Abdominal aorta: No aneurysm visualized.

Other findings: None.

ULTRASOUND HEPATIC ELASTOGRAPHY

Device: Siemens Helix VTQ

Patient position: Left Lateral Decubitus

Transducer [id number]

Number of measurements: 10

Hepatic segment:  8

Median velocity:   1.55  m/sec

IQR:

IQR/Median velocity ratio:

Corresponding Metavir fibrosis score:  F2 + some F3

Risk of fibrosis: Moderate

Limitations of exam: None

Pertinent findings noted on other imaging exams:  None

Please note that abnormal shear wave velocities may also be
identified in clinical settings other than with hepatic fibrosis,
such as: acute hepatitis, elevated right heart and central venous
pressures including use of beta blockers, Deleon disease
([place]), infiltrative processes such as
mastocytosis/amyloidosis/infiltrative tumor, extrahepatic
cholestasis, in the post-prandial state, and liver transplantation.
Correlation with patient history, laboratory data, and clinical
condition recommended.
IMPRESSION: ULTRASOUND ABDOMEN:
Diffuse hepatic steatosis.  No hepatic mass visualized.

Spleen near upper limits of normal in size.  No evidence of ascites.

No evidence of gallstones, biliary dilatation, or other acute
findings.

ULTRASOUND HEPATIC ELASTOGRAPHY:

Median hepatic shear wave velocity is calculated at 1.55 m/sec.

Corresponding Metavir fibrosis score is  F2 + some F3.

Risk of fibrosis is Moderate.

Follow-up: Additional testing appropriate

## 2018-11-06 DIAGNOSIS — I10 Essential (primary) hypertension: Secondary | ICD-10-CM | POA: Diagnosis not present

## 2018-11-06 DIAGNOSIS — Z89512 Acquired absence of left leg below knee: Secondary | ICD-10-CM | POA: Diagnosis not present

## 2018-11-06 DIAGNOSIS — J449 Chronic obstructive pulmonary disease, unspecified: Secondary | ICD-10-CM | POA: Diagnosis not present

## 2018-11-06 DIAGNOSIS — Z1389 Encounter for screening for other disorder: Secondary | ICD-10-CM | POA: Diagnosis not present

## 2018-11-06 DIAGNOSIS — K7460 Unspecified cirrhosis of liver: Secondary | ICD-10-CM | POA: Diagnosis not present

## 2018-11-06 DIAGNOSIS — Z0000 Encounter for general adult medical examination without abnormal findings: Secondary | ICD-10-CM | POA: Diagnosis not present

## 2018-11-06 DIAGNOSIS — Z1331 Encounter for screening for depression: Secondary | ICD-10-CM | POA: Diagnosis not present

## 2018-11-06 DIAGNOSIS — R7309 Other abnormal glucose: Secondary | ICD-10-CM | POA: Diagnosis not present

## 2019-02-06 DIAGNOSIS — F17218 Nicotine dependence, cigarettes, with other nicotine-induced disorders: Secondary | ICD-10-CM | POA: Diagnosis not present

## 2019-02-06 DIAGNOSIS — I10 Essential (primary) hypertension: Secondary | ICD-10-CM | POA: Diagnosis not present

## 2019-02-06 DIAGNOSIS — J449 Chronic obstructive pulmonary disease, unspecified: Secondary | ICD-10-CM | POA: Diagnosis not present

## 2019-02-06 DIAGNOSIS — Z89512 Acquired absence of left leg below knee: Secondary | ICD-10-CM | POA: Diagnosis not present

## 2019-05-14 DIAGNOSIS — J449 Chronic obstructive pulmonary disease, unspecified: Secondary | ICD-10-CM | POA: Diagnosis not present

## 2019-05-14 DIAGNOSIS — I10 Essential (primary) hypertension: Secondary | ICD-10-CM | POA: Diagnosis not present

## 2019-05-14 DIAGNOSIS — F17210 Nicotine dependence, cigarettes, uncomplicated: Secondary | ICD-10-CM | POA: Diagnosis not present

## 2019-05-14 DIAGNOSIS — F17218 Nicotine dependence, cigarettes, with other nicotine-induced disorders: Secondary | ICD-10-CM | POA: Diagnosis not present

## 2019-05-14 DIAGNOSIS — Z89512 Acquired absence of left leg below knee: Secondary | ICD-10-CM | POA: Diagnosis not present

## 2019-06-14 DIAGNOSIS — Z89512 Acquired absence of left leg below knee: Secondary | ICD-10-CM | POA: Diagnosis not present

## 2019-06-14 DIAGNOSIS — K709 Alcoholic liver disease, unspecified: Secondary | ICD-10-CM | POA: Diagnosis not present

## 2019-07-14 DIAGNOSIS — J449 Chronic obstructive pulmonary disease, unspecified: Secondary | ICD-10-CM | POA: Diagnosis not present

## 2019-07-14 DIAGNOSIS — I10 Essential (primary) hypertension: Secondary | ICD-10-CM | POA: Diagnosis not present

## 2019-10-14 DIAGNOSIS — I10 Essential (primary) hypertension: Secondary | ICD-10-CM | POA: Diagnosis not present

## 2019-10-14 DIAGNOSIS — K709 Alcoholic liver disease, unspecified: Secondary | ICD-10-CM | POA: Diagnosis not present

## 2019-11-14 DIAGNOSIS — I10 Essential (primary) hypertension: Secondary | ICD-10-CM | POA: Diagnosis not present

## 2019-11-14 DIAGNOSIS — J449 Chronic obstructive pulmonary disease, unspecified: Secondary | ICD-10-CM | POA: Diagnosis not present

## 2019-12-12 DIAGNOSIS — I10 Essential (primary) hypertension: Secondary | ICD-10-CM | POA: Diagnosis not present

## 2019-12-12 DIAGNOSIS — J449 Chronic obstructive pulmonary disease, unspecified: Secondary | ICD-10-CM | POA: Diagnosis not present

## 2020-01-10 ENCOUNTER — Ambulatory Visit (INDEPENDENT_AMBULATORY_CARE_PROVIDER_SITE_OTHER): Payer: Medicare Other | Admitting: Orthopaedic Surgery

## 2020-01-10 ENCOUNTER — Other Ambulatory Visit: Payer: Self-pay

## 2020-01-10 ENCOUNTER — Encounter: Payer: Self-pay | Admitting: Orthopaedic Surgery

## 2020-01-10 VITALS — BP 163/93 | HR 100 | Ht 71.0 in | Wt 148.0 lb

## 2020-01-10 DIAGNOSIS — Z89512 Acquired absence of left leg below knee: Secondary | ICD-10-CM | POA: Diagnosis not present

## 2020-01-10 DIAGNOSIS — Z1331 Encounter for screening for depression: Secondary | ICD-10-CM | POA: Diagnosis not present

## 2020-01-10 DIAGNOSIS — I10 Essential (primary) hypertension: Secondary | ICD-10-CM | POA: Diagnosis not present

## 2020-01-10 DIAGNOSIS — K7460 Unspecified cirrhosis of liver: Secondary | ICD-10-CM | POA: Diagnosis not present

## 2020-01-10 DIAGNOSIS — Z1389 Encounter for screening for other disorder: Secondary | ICD-10-CM | POA: Diagnosis not present

## 2020-01-10 DIAGNOSIS — F17210 Nicotine dependence, cigarettes, uncomplicated: Secondary | ICD-10-CM | POA: Diagnosis not present

## 2020-01-10 DIAGNOSIS — J449 Chronic obstructive pulmonary disease, unspecified: Secondary | ICD-10-CM | POA: Diagnosis not present

## 2020-01-10 NOTE — Progress Notes (Signed)
Patient [id number] Jeff Stevenson, male DOB:05/13/1961, 59 y.o. [id number]  Chief Complaint  Patient presents with  . Leg Problem    left/ needs orders for prosthesis     HPI  Jeff Stevenson is a 59 y.o. male who has a left below the knee amputation.  He needs a new prosthesis and socks.  I have written Rx for this.  His prosthetist will contact me for the exact Rx for him.  I will see him after he gets the new prosthesis and it is fitting OK.  He has no stump or wound problem.   Body mass index is 20.64 kg/m.  ROS  Review of Systems  Constitutional: Positive for activity change.  Musculoskeletal: Positive for arthralgias and gait problem.  All other systems reviewed and are negative.   All other systems reviewed and are negative.  The following is a summary of the past history medically, past history surgically, known current medicines, social history and family history.  This information is gathered electronically by the computer from prior information and documentation.  I review this each visit and have found including this information at this point in the chart is beneficial and informative.    Past Medical History:  Diagnosis Date  . ETOH abuse   . Hepatitis C   . Psychosis [facility])     Past Surgical History:  Procedure Laterality Date  . BELOW KNEE LEG AMPUTATION    . broke his back    . CHEST TUBE INSERTION    . GASTROSTOMY W/ FEEDING TUBE     and removal  . SPLENECTOMY    . TRACHEOSTOMY     and removal     Family History  Problem Relation Age of Onset  . Breast cancer Mother   . Colon cancer Neg Hx     Social History Social History   Tobacco Use  . Smoking status: Current Every Day Smoker    Packs/day: 1.00  . Smokeless tobacco: Never Used  Substance Use Topics  . Alcohol use: Yes    Comment: 4- 40 OZ DAILY  . Drug use: Yes    Types: Marijuana    Comment: history of intranasal cocaine, no IV drug use, none currently     No Known  Allergies  Current Outpatient Medications  Medication Sig Dispense Refill  . hydrOXYzine (ATARAX/VISTARIL) 25 MG tablet Take 1 tablet (25 mg) four times daily as needed: For anxiety 60 tablet 0  . lisinopril-hydrochlorothiazide (PRINZIDE,ZESTORETIC) 10-12.5 MG tablet Take 1 tablet by mouth daily. For high blood pressure 30 tablet 0  . SYMBICORT 160-4.5 MCG/ACT inhaler Inhale 2 puffs into the lungs 2 (two) times daily. For Asthma 1 Inhaler 0  . traZODone (DESYREL) 50 MG tablet Take 1 tablet (50 mg total) by mouth at bedtime as needed for sleep. 30 tablet 0  . VENTOLIN HFA 108 (90 Base) MCG/ACT inhaler Inhale 1 puff into the lungs every 4 (four) hours as needed for wheezing or shortness of breath.     No current facility-administered medications for this visit.     Physical Exam  Blood pressure (!) 163/93, pulse 100, height 5\' 11"  (1.803 m), weight 148 lb (67.1 kg).  Constitutional: overall normal hygiene, normal nutrition, well developed, normal grooming, normal body habitus. Assistive device:Left below knee prosthesis, in need of replacement  Musculoskeletal: gait and station Limp left, muscle tone and strength are normal on right, left below knee amputation, no tremors or atrophy is present.  .  Neurological: coordination  overall normal.  Deep tendon reflex/nerve stretch intact.  Sensation normal.  Cranial nerves II-XII intact. Left below knee amputation  Skin:   Normal overall no scars, lesions, ulcers or rashes other than left stump. No psoriasis.  Psychiatric: Alert and oriented x 3.  Recent memory intact, remote memory unclear.  Normal mood and affect. Well groomed.  Good eye contact.  Cardiovascular: overall no swelling, no varicosities, no edema right leg, normal temperatures of the legs and arms, no clubbing, cyanosis and good capillary refill. Left below knee amputation is present.  Lymphatic: palpation is normal.  All other systems reviewed and are negative   The patient  has been educated about the nature of the problem(s) and counseled on treatment options.  The patient appeared to understand what I have discussed and is in agreement with it.  Encounter Diagnosis  Name Primary?  . History of left below knee amputation ([place]) Yes    PLAN Call if any problems.  Precautions discussed.  Continue current medications.   Return to clinic after gets new prosthesis and it is fitting well.   Electronically Signed Sanjuana Kava, MD 4/22/20212:00 PM

## 2020-01-10 NOTE — Progress Notes (Signed)
l °

## 2020-01-23 ENCOUNTER — Telehealth: Payer: Self-pay | Admitting: Orthopaedic Surgery

## 2020-01-23 NOTE — Telephone Encounter (Signed)
Call received from [facility], ph [phone] / fax [phone] - requested notes of 01/10/20 - faxed / entered through HIM/Release of information as noted.

## 2020-02-09 DIAGNOSIS — J449 Chronic obstructive pulmonary disease, unspecified: Secondary | ICD-10-CM | POA: Diagnosis not present

## 2020-02-09 DIAGNOSIS — I10 Essential (primary) hypertension: Secondary | ICD-10-CM | POA: Diagnosis not present

## 2020-04-10 DIAGNOSIS — I10 Essential (primary) hypertension: Secondary | ICD-10-CM | POA: Diagnosis not present

## 2020-04-10 DIAGNOSIS — K7460 Unspecified cirrhosis of liver: Secondary | ICD-10-CM | POA: Diagnosis not present

## 2020-05-11 DIAGNOSIS — I10 Essential (primary) hypertension: Secondary | ICD-10-CM | POA: Diagnosis not present

## 2020-05-11 DIAGNOSIS — J449 Chronic obstructive pulmonary disease, unspecified: Secondary | ICD-10-CM | POA: Diagnosis not present

## 2020-06-11 DIAGNOSIS — J449 Chronic obstructive pulmonary disease, unspecified: Secondary | ICD-10-CM | POA: Diagnosis not present

## 2020-06-11 DIAGNOSIS — I10 Essential (primary) hypertension: Secondary | ICD-10-CM | POA: Diagnosis not present

## 2020-07-10 DIAGNOSIS — J441 Chronic obstructive pulmonary disease with (acute) exacerbation: Secondary | ICD-10-CM | POA: Diagnosis not present

## 2020-07-10 DIAGNOSIS — Z89512 Acquired absence of left leg below knee: Secondary | ICD-10-CM | POA: Diagnosis not present

## 2020-07-10 DIAGNOSIS — F17210 Nicotine dependence, cigarettes, uncomplicated: Secondary | ICD-10-CM | POA: Diagnosis not present

## 2020-07-10 DIAGNOSIS — I10 Essential (primary) hypertension: Secondary | ICD-10-CM | POA: Diagnosis not present

## 2020-07-10 DIAGNOSIS — F17218 Nicotine dependence, cigarettes, with other nicotine-induced disorders: Secondary | ICD-10-CM | POA: Diagnosis not present

## 2020-08-10 DIAGNOSIS — I10 Essential (primary) hypertension: Secondary | ICD-10-CM | POA: Diagnosis not present

## 2020-08-10 DIAGNOSIS — J449 Chronic obstructive pulmonary disease, unspecified: Secondary | ICD-10-CM | POA: Diagnosis not present

## 2020-09-09 DIAGNOSIS — I10 Essential (primary) hypertension: Secondary | ICD-10-CM | POA: Diagnosis not present

## 2020-09-09 DIAGNOSIS — J449 Chronic obstructive pulmonary disease, unspecified: Secondary | ICD-10-CM | POA: Diagnosis not present

## 2020-10-10 DIAGNOSIS — J449 Chronic obstructive pulmonary disease, unspecified: Secondary | ICD-10-CM | POA: Diagnosis not present

## 2020-10-10 DIAGNOSIS — F17218 Nicotine dependence, cigarettes, with other nicotine-induced disorders: Secondary | ICD-10-CM | POA: Diagnosis not present

## 2020-11-10 DIAGNOSIS — F17218 Nicotine dependence, cigarettes, with other nicotine-induced disorders: Secondary | ICD-10-CM | POA: Diagnosis not present

## 2020-11-10 DIAGNOSIS — J449 Chronic obstructive pulmonary disease, unspecified: Secondary | ICD-10-CM | POA: Diagnosis not present

## 2020-12-12 DIAGNOSIS — F17210 Nicotine dependence, cigarettes, uncomplicated: Secondary | ICD-10-CM | POA: Diagnosis not present

## 2020-12-12 DIAGNOSIS — I10 Essential (primary) hypertension: Secondary | ICD-10-CM | POA: Diagnosis not present

## 2021-01-07 DIAGNOSIS — K7460 Unspecified cirrhosis of liver: Secondary | ICD-10-CM | POA: Diagnosis not present

## 2021-01-07 DIAGNOSIS — I10 Essential (primary) hypertension: Secondary | ICD-10-CM | POA: Diagnosis not present

## 2021-01-07 DIAGNOSIS — J449 Chronic obstructive pulmonary disease, unspecified: Secondary | ICD-10-CM | POA: Diagnosis not present

## 2021-01-07 DIAGNOSIS — K709 Alcoholic liver disease, unspecified: Secondary | ICD-10-CM | POA: Diagnosis not present

## 2021-01-12 ENCOUNTER — Other Ambulatory Visit: Payer: Self-pay | Admitting: Internal Medicine

## 2021-01-12 ENCOUNTER — Other Ambulatory Visit (HOSPITAL_COMMUNITY): Payer: Self-pay | Admitting: Internal Medicine

## 2021-01-12 DIAGNOSIS — K709 Alcoholic liver disease, unspecified: Secondary | ICD-10-CM | POA: Diagnosis not present

## 2021-01-12 DIAGNOSIS — Z89512 Acquired absence of left leg below knee: Secondary | ICD-10-CM | POA: Diagnosis not present

## 2021-01-12 DIAGNOSIS — Z1331 Encounter for screening for depression: Secondary | ICD-10-CM | POA: Diagnosis not present

## 2021-01-12 DIAGNOSIS — Z1389 Encounter for screening for other disorder: Secondary | ICD-10-CM | POA: Diagnosis not present

## 2021-01-12 DIAGNOSIS — J449 Chronic obstructive pulmonary disease, unspecified: Secondary | ICD-10-CM | POA: Diagnosis not present

## 2021-01-12 DIAGNOSIS — Z87891 Personal history of nicotine dependence: Secondary | ICD-10-CM

## 2021-01-12 DIAGNOSIS — F17210 Nicotine dependence, cigarettes, uncomplicated: Secondary | ICD-10-CM | POA: Diagnosis not present

## 2021-01-12 DIAGNOSIS — I10 Essential (primary) hypertension: Secondary | ICD-10-CM | POA: Diagnosis not present

## 2021-02-10 ENCOUNTER — Ambulatory Visit (HOSPITAL_COMMUNITY)
Admission: RE | Admit: 2021-02-10 | Discharge: 2021-02-10 | Disposition: A | Discharge status: Home / Self Care | Payer: Medicare Other | Source: Ambulatory Visit | Attending: Internal Medicine | Admitting: Internal Medicine

## 2021-02-10 ENCOUNTER — Other Ambulatory Visit: Payer: Self-pay

## 2021-02-10 DIAGNOSIS — Z87891 Personal history of nicotine dependence: Secondary | ICD-10-CM | POA: Diagnosis not present

## 2021-02-10 DIAGNOSIS — F17210 Nicotine dependence, cigarettes, uncomplicated: Secondary | ICD-10-CM | POA: Diagnosis not present

## 2021-02-11 DIAGNOSIS — I10 Essential (primary) hypertension: Secondary | ICD-10-CM | POA: Diagnosis not present

## 2021-02-11 DIAGNOSIS — K709 Alcoholic liver disease, unspecified: Secondary | ICD-10-CM | POA: Diagnosis not present

## 2021-03-14 DIAGNOSIS — Z89512 Acquired absence of left leg below knee: Secondary | ICD-10-CM | POA: Diagnosis not present

## 2021-03-14 DIAGNOSIS — I10 Essential (primary) hypertension: Secondary | ICD-10-CM | POA: Diagnosis not present

## 2021-04-13 DIAGNOSIS — I10 Essential (primary) hypertension: Secondary | ICD-10-CM | POA: Diagnosis not present

## 2021-04-13 DIAGNOSIS — I700 Atherosclerosis of aorta: Secondary | ICD-10-CM | POA: Diagnosis not present

## 2021-05-14 DIAGNOSIS — I10 Essential (primary) hypertension: Secondary | ICD-10-CM | POA: Diagnosis not present

## 2021-05-14 DIAGNOSIS — F17218 Nicotine dependence, cigarettes, with other nicotine-induced disorders: Secondary | ICD-10-CM | POA: Diagnosis not present

## 2021-06-14 DIAGNOSIS — J449 Chronic obstructive pulmonary disease, unspecified: Secondary | ICD-10-CM | POA: Diagnosis not present

## 2021-06-14 DIAGNOSIS — I10 Essential (primary) hypertension: Secondary | ICD-10-CM | POA: Diagnosis not present

## 2021-10-22 DIAGNOSIS — J449 Chronic obstructive pulmonary disease, unspecified: Secondary | ICD-10-CM | POA: Diagnosis not present

## 2021-10-22 DIAGNOSIS — I10 Essential (primary) hypertension: Secondary | ICD-10-CM | POA: Diagnosis not present

## 2021-10-22 DIAGNOSIS — F17218 Nicotine dependence, cigarettes, with other nicotine-induced disorders: Secondary | ICD-10-CM | POA: Diagnosis not present

## 2021-10-22 DIAGNOSIS — I700 Atherosclerosis of aorta: Secondary | ICD-10-CM | POA: Diagnosis not present

## 2021-12-21 DIAGNOSIS — Z1331 Encounter for screening for depression: Secondary | ICD-10-CM | POA: Diagnosis not present

## 2021-12-21 DIAGNOSIS — Z1389 Encounter for screening for other disorder: Secondary | ICD-10-CM | POA: Diagnosis not present

## 2021-12-21 DIAGNOSIS — J449 Chronic obstructive pulmonary disease, unspecified: Secondary | ICD-10-CM | POA: Diagnosis not present

## 2021-12-21 DIAGNOSIS — I10 Essential (primary) hypertension: Secondary | ICD-10-CM | POA: Diagnosis not present

## 2021-12-21 DIAGNOSIS — Z89512 Acquired absence of left leg below knee: Secondary | ICD-10-CM | POA: Diagnosis not present

## 2021-12-21 DIAGNOSIS — F17210 Nicotine dependence, cigarettes, uncomplicated: Secondary | ICD-10-CM | POA: Diagnosis not present

## 2021-12-21 DIAGNOSIS — I700 Atherosclerosis of aorta: Secondary | ICD-10-CM | POA: Diagnosis not present

## 2022-03-08 NOTE — Addendum Note (Signed)
Encounter addended by: Annie Paras on: 03/08/2022 3:24 PM  Actions taken: Letter saved

## 2022-06-21 ENCOUNTER — Other Ambulatory Visit (HOSPITAL_COMMUNITY): Payer: Self-pay | Admitting: Gerontology

## 2022-06-21 ENCOUNTER — Other Ambulatory Visit: Payer: Self-pay | Admitting: Gerontology

## 2022-06-21 DIAGNOSIS — I10 Essential (primary) hypertension: Secondary | ICD-10-CM | POA: Diagnosis not present

## 2022-06-21 DIAGNOSIS — G4700 Insomnia, unspecified: Secondary | ICD-10-CM | POA: Diagnosis not present

## 2022-06-21 DIAGNOSIS — Z87891 Personal history of nicotine dependence: Secondary | ICD-10-CM

## 2022-06-21 DIAGNOSIS — F17218 Nicotine dependence, cigarettes, with other nicotine-induced disorders: Secondary | ICD-10-CM | POA: Diagnosis not present

## 2022-06-21 DIAGNOSIS — F17210 Nicotine dependence, cigarettes, uncomplicated: Secondary | ICD-10-CM | POA: Diagnosis not present

## 2022-06-21 DIAGNOSIS — J449 Chronic obstructive pulmonary disease, unspecified: Secondary | ICD-10-CM | POA: Diagnosis not present

## 2022-06-28 DIAGNOSIS — I10 Essential (primary) hypertension: Secondary | ICD-10-CM | POA: Diagnosis not present

## 2022-06-28 DIAGNOSIS — K709 Alcoholic liver disease, unspecified: Secondary | ICD-10-CM | POA: Diagnosis not present

## 2022-06-28 DIAGNOSIS — Z0001 Encounter for general adult medical examination with abnormal findings: Secondary | ICD-10-CM | POA: Diagnosis not present

## 2022-07-22 DIAGNOSIS — J449 Chronic obstructive pulmonary disease, unspecified: Secondary | ICD-10-CM | POA: Diagnosis not present

## 2022-07-22 DIAGNOSIS — I10 Essential (primary) hypertension: Secondary | ICD-10-CM | POA: Diagnosis not present

## 2022-08-21 DIAGNOSIS — I10 Essential (primary) hypertension: Secondary | ICD-10-CM | POA: Diagnosis not present

## 2022-08-21 DIAGNOSIS — J449 Chronic obstructive pulmonary disease, unspecified: Secondary | ICD-10-CM | POA: Diagnosis not present

## 2022-09-22 DIAGNOSIS — J449 Chronic obstructive pulmonary disease, unspecified: Secondary | ICD-10-CM | POA: Diagnosis not present

## 2022-09-22 DIAGNOSIS — I10 Essential (primary) hypertension: Secondary | ICD-10-CM | POA: Diagnosis not present

## 2022-10-23 DIAGNOSIS — J449 Chronic obstructive pulmonary disease, unspecified: Secondary | ICD-10-CM | POA: Diagnosis not present

## 2022-10-23 DIAGNOSIS — I10 Essential (primary) hypertension: Secondary | ICD-10-CM | POA: Diagnosis not present

## 2022-11-02 ENCOUNTER — Ambulatory Visit: Payer: Medicare Other | Admitting: Orthopaedic Surgery

## 2022-11-21 DIAGNOSIS — I10 Essential (primary) hypertension: Secondary | ICD-10-CM | POA: Diagnosis not present

## 2022-11-21 DIAGNOSIS — J449 Chronic obstructive pulmonary disease, unspecified: Secondary | ICD-10-CM | POA: Diagnosis not present

## 2022-12-24 DIAGNOSIS — J449 Chronic obstructive pulmonary disease, unspecified: Secondary | ICD-10-CM | POA: Diagnosis not present

## 2022-12-24 DIAGNOSIS — I10 Essential (primary) hypertension: Secondary | ICD-10-CM | POA: Diagnosis not present

## 2022-12-24 DIAGNOSIS — R972 Elevated prostate specific antigen [PSA]: Secondary | ICD-10-CM | POA: Diagnosis not present

## 2022-12-24 DIAGNOSIS — Z0001 Encounter for general adult medical examination with abnormal findings: Secondary | ICD-10-CM | POA: Diagnosis not present

## 2023-01-19 DIAGNOSIS — I10 Essential (primary) hypertension: Secondary | ICD-10-CM | POA: Diagnosis not present

## 2023-01-19 DIAGNOSIS — J449 Chronic obstructive pulmonary disease, unspecified: Secondary | ICD-10-CM | POA: Diagnosis not present

## 2023-02-17 ENCOUNTER — Ambulatory Visit (HOSPITAL_COMMUNITY): Admission: RE | Admit: 2023-02-17 | Payer: Medicare Other | Source: Ambulatory Visit

## 2023-02-19 DIAGNOSIS — I10 Essential (primary) hypertension: Secondary | ICD-10-CM | POA: Diagnosis not present

## 2023-02-19 DIAGNOSIS — J449 Chronic obstructive pulmonary disease, unspecified: Secondary | ICD-10-CM | POA: Diagnosis not present

## 2023-03-23 ENCOUNTER — Ambulatory Visit (HOSPITAL_COMMUNITY): Admission: RE | Admit: 2023-03-23 | Payer: Medicare Other | Source: Ambulatory Visit

## 2023-03-23 ENCOUNTER — Encounter (HOSPITAL_COMMUNITY): Payer: Self-pay

## 2023-03-31 DIAGNOSIS — I10 Essential (primary) hypertension: Secondary | ICD-10-CM | POA: Diagnosis not present

## 2023-03-31 DIAGNOSIS — J449 Chronic obstructive pulmonary disease, unspecified: Secondary | ICD-10-CM | POA: Diagnosis not present

## 2023-04-14 ENCOUNTER — Ambulatory Visit (HOSPITAL_COMMUNITY): Payer: Medicare Other

## 2023-04-14 ENCOUNTER — Ambulatory Visit (HOSPITAL_COMMUNITY): Admission: RE | Admit: 2023-04-14 | Payer: Medicare Other | Source: Ambulatory Visit

## 2023-04-23 IMAGING — CT CT CHEST LUNG CANCER SCREENING LOW DOSE W/O CM
1 series · 10 of 10 positions shown, 13 images · non-contrast
Comparison: 03/04/2017 chest radiograph. Diagnostic CT of
10/10/2013.

CLINICAL DATA: Current smoker.  Sixty-eight pack-year history.

EXAM:
CT CHEST WITHOUT CONTRAST LOW-DOSE FOR LUNG CANCER SCREENING
TECHNIQUE: Multidetector CT imaging of the chest was performed following the
standard protocol without IV contrast.

[ct lung segmentation data · axial · 0.68mm/px · z∈[-406,-406]mm · 10 of 392 frames shown]
[frame 1/392  mediastinal]
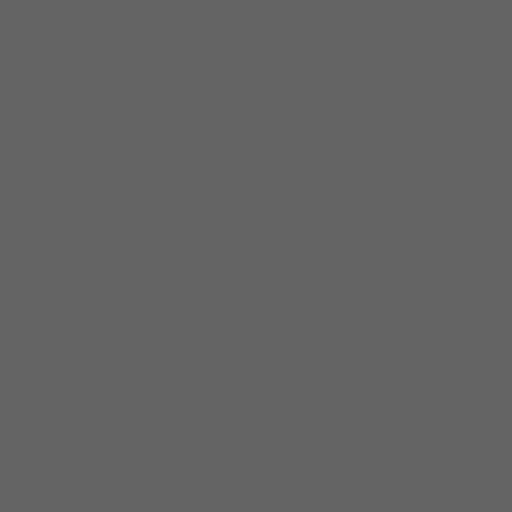
[frame 1/392  lung]
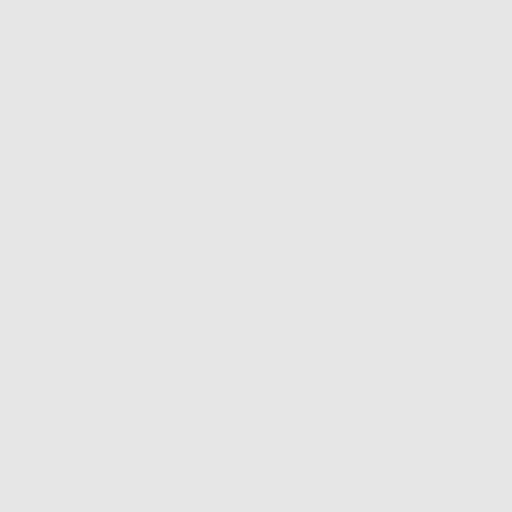
[frame 44/392  lung]
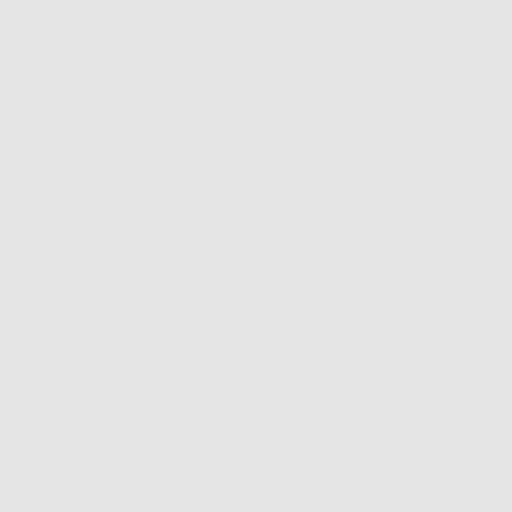
[frame 87/392  lung]
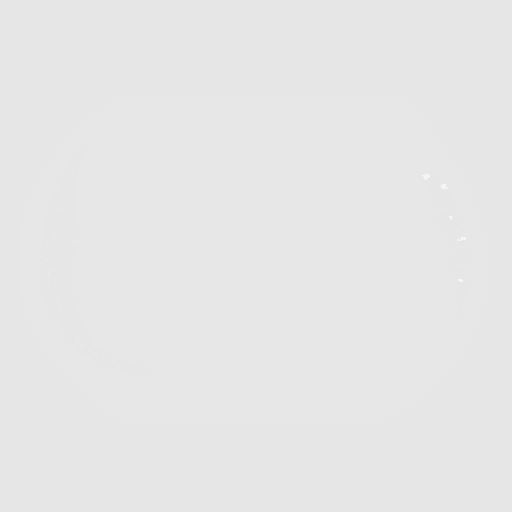
[frame 131/392  lung]
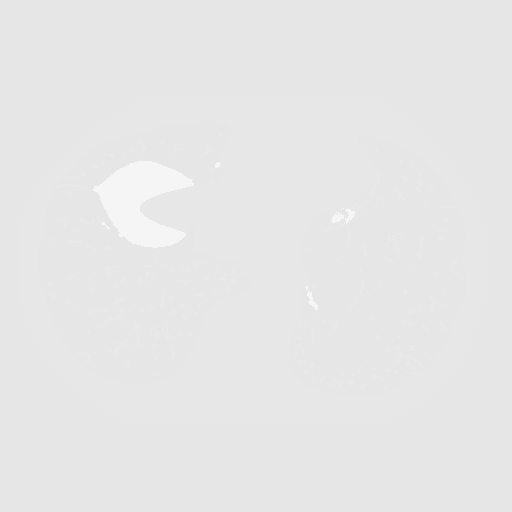
[frame 174/392  mediastinal]
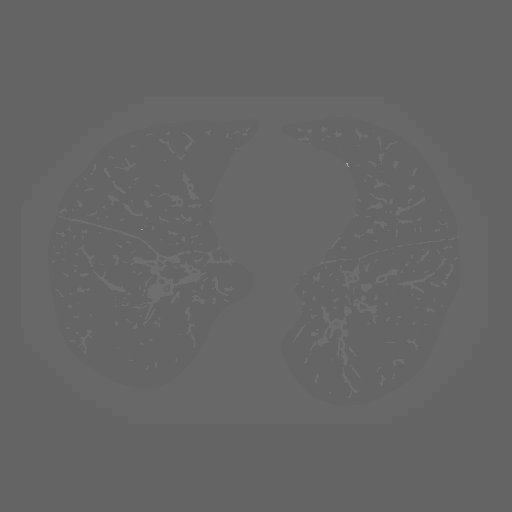
[frame 174/392  lung]
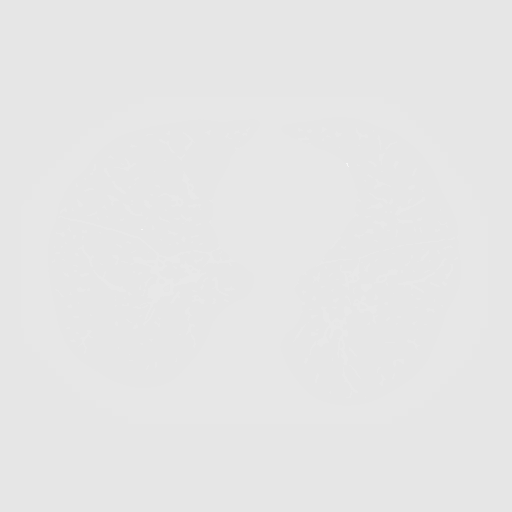
[frame 218/392  lung]
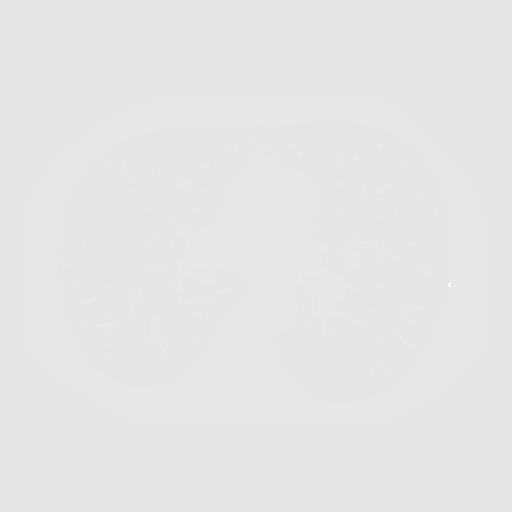
[frame 261/392  lung]
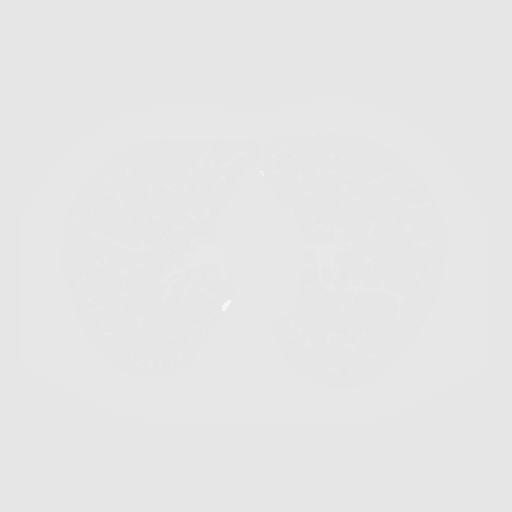
[frame 305/392  lung]
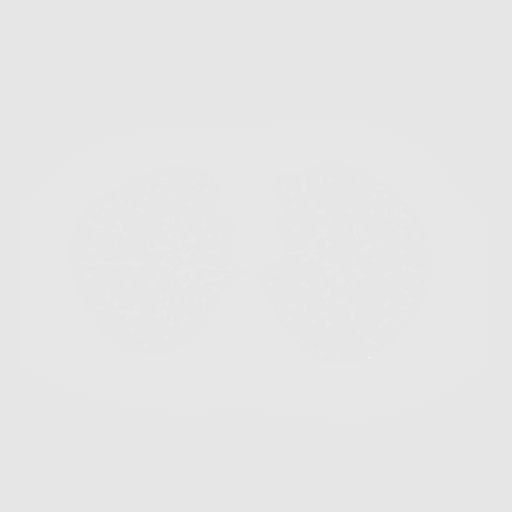
[frame 348/392  mediastinal]
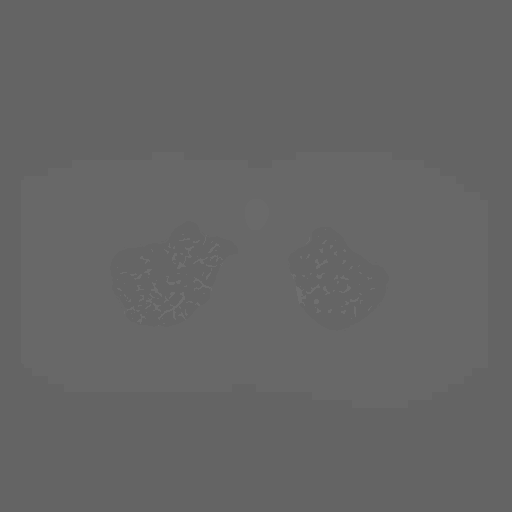
[frame 348/392  lung]
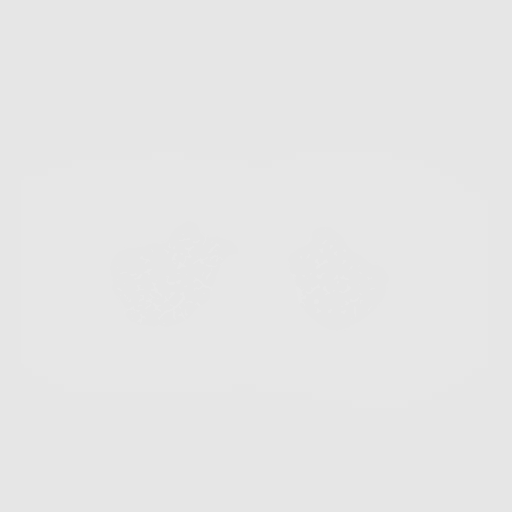
[frame 392/392  lung]
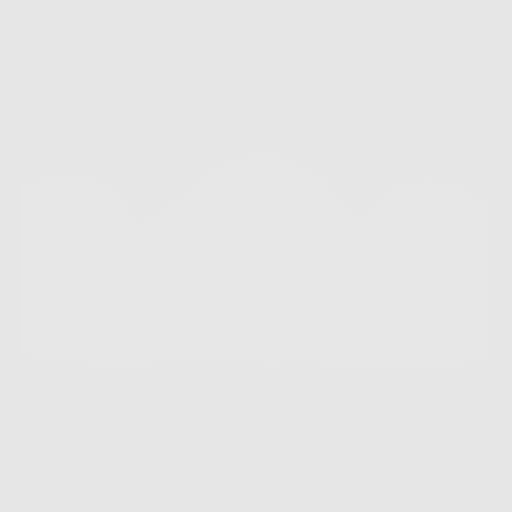

[10 of 10 positions shown; findings below may reference images not displayed]

FINDINGS: Cardiovascular: Aortic atherosclerosis. Tortuous thoracic aorta.
Normal heart size, without pericardial effusion. Lad and right
coronary artery calcification.

Mediastinum/Nodes: No mediastinal or definite hilar adenopathy,
given limitations of unenhanced CT.

Lungs/Pleura: Right hemidiaphragm eventration involving a portion of
the right lobe of the liver, as before. No pleural fluid. Moderate
centrilobular emphysema. Basilar predominant bronchial wall
thickening and peribronchial interstitial thickening. Biapical
pleuroparenchymal scarring.

Pulmonary nodules of maximally volume derived equivalent diameter
5.4 mm.

Upper Abdomen: Minimal herniation of the caudate lobe into the lower
chest including on 52/2, similar to the prior diagnostic CT.
Probable embolization coils in the IVC. Normal imaged portions of
the spleen, stomach, pancreas, adrenal glands, kidneys. Abdominal
aortic atherosclerosis.

Musculoskeletal: No acute osseous abnormality. Remote trauma
involving the coracoid process of the right scapula.
IMPRESSION: 1. Lung-RADS 2, benign appearance or behavior. Continue annual
screening with low-dose chest CT without contrast in 12 months.
2. Aortic Atherosclerosis (W5DKV-028.8) and Emphysema (W5DKV-0B9.C).
Coronary artery atherosclerosis.

## 2023-05-01 DIAGNOSIS — J449 Chronic obstructive pulmonary disease, unspecified: Secondary | ICD-10-CM | POA: Diagnosis not present

## 2023-05-01 DIAGNOSIS — I10 Essential (primary) hypertension: Secondary | ICD-10-CM | POA: Diagnosis not present

## 2023-05-12 DIAGNOSIS — H6121 Impacted cerumen, right ear: Secondary | ICD-10-CM | POA: Diagnosis not present

## 2023-05-12 DIAGNOSIS — I10 Essential (primary) hypertension: Secondary | ICD-10-CM | POA: Diagnosis not present

## 2023-06-12 DIAGNOSIS — J449 Chronic obstructive pulmonary disease, unspecified: Secondary | ICD-10-CM | POA: Diagnosis not present

## 2023-06-12 DIAGNOSIS — I10 Essential (primary) hypertension: Secondary | ICD-10-CM | POA: Diagnosis not present

## 2023-06-17 ENCOUNTER — Other Ambulatory Visit (HOSPITAL_COMMUNITY): Payer: Self-pay | Admitting: Gerontology

## 2023-06-17 DIAGNOSIS — Z87891 Personal history of nicotine dependence: Secondary | ICD-10-CM

## 2023-06-28 ENCOUNTER — Ambulatory Visit (HOSPITAL_COMMUNITY)
Admission: RE | Admit: 2023-06-28 | Discharge: 2023-06-28 | Disposition: A | Discharge status: Home / Self Care | Payer: Medicare Other | Source: Ambulatory Visit | Attending: Gerontology | Admitting: Gerontology

## 2023-06-28 DIAGNOSIS — G4700 Insomnia, unspecified: Secondary | ICD-10-CM | POA: Diagnosis not present

## 2023-06-28 DIAGNOSIS — Z87891 Personal history of nicotine dependence: Secondary | ICD-10-CM | POA: Diagnosis not present

## 2023-06-28 DIAGNOSIS — J449 Chronic obstructive pulmonary disease, unspecified: Secondary | ICD-10-CM | POA: Diagnosis not present

## 2023-06-28 DIAGNOSIS — F17218 Nicotine dependence, cigarettes, with other nicotine-induced disorders: Secondary | ICD-10-CM | POA: Diagnosis not present

## 2023-06-28 DIAGNOSIS — I10 Essential (primary) hypertension: Secondary | ICD-10-CM | POA: Diagnosis not present

## 2023-06-28 DIAGNOSIS — F17210 Nicotine dependence, cigarettes, uncomplicated: Secondary | ICD-10-CM | POA: Diagnosis not present

## 2023-07-27 DIAGNOSIS — D120 Benign neoplasm of cecum: Secondary | ICD-10-CM | POA: Diagnosis not present

## 2023-07-27 DIAGNOSIS — D127 Benign neoplasm of rectosigmoid junction: Secondary | ICD-10-CM | POA: Diagnosis not present

## 2023-07-27 DIAGNOSIS — Z1211 Encounter for screening for malignant neoplasm of colon: Secondary | ICD-10-CM | POA: Diagnosis not present

## 2023-07-27 DIAGNOSIS — D125 Benign neoplasm of sigmoid colon: Secondary | ICD-10-CM | POA: Diagnosis not present

## 2023-07-27 DIAGNOSIS — D122 Benign neoplasm of ascending colon: Secondary | ICD-10-CM | POA: Diagnosis not present

## 2023-07-27 DIAGNOSIS — D123 Benign neoplasm of transverse colon: Secondary | ICD-10-CM | POA: Diagnosis not present

## 2023-07-27 DIAGNOSIS — D128 Benign neoplasm of rectum: Secondary | ICD-10-CM | POA: Diagnosis not present

## 2023-07-29 DIAGNOSIS — D127 Benign neoplasm of rectosigmoid junction: Secondary | ICD-10-CM | POA: Diagnosis not present

## 2023-07-29 DIAGNOSIS — I10 Essential (primary) hypertension: Secondary | ICD-10-CM | POA: Diagnosis not present

## 2023-07-29 DIAGNOSIS — D125 Benign neoplasm of sigmoid colon: Secondary | ICD-10-CM | POA: Diagnosis not present

## 2023-07-29 DIAGNOSIS — D128 Benign neoplasm of rectum: Secondary | ICD-10-CM | POA: Diagnosis not present

## 2023-07-29 DIAGNOSIS — J449 Chronic obstructive pulmonary disease, unspecified: Secondary | ICD-10-CM | POA: Diagnosis not present

## 2023-07-29 DIAGNOSIS — D122 Benign neoplasm of ascending colon: Secondary | ICD-10-CM | POA: Diagnosis not present

## 2023-07-29 DIAGNOSIS — D123 Benign neoplasm of transverse colon: Secondary | ICD-10-CM | POA: Diagnosis not present

## 2023-07-29 DIAGNOSIS — D120 Benign neoplasm of cecum: Secondary | ICD-10-CM | POA: Diagnosis not present

## 2023-08-28 DIAGNOSIS — J449 Chronic obstructive pulmonary disease, unspecified: Secondary | ICD-10-CM | POA: Diagnosis not present

## 2023-08-28 DIAGNOSIS — I10 Essential (primary) hypertension: Secondary | ICD-10-CM | POA: Diagnosis not present

## 2023-09-28 DIAGNOSIS — I10 Essential (primary) hypertension: Secondary | ICD-10-CM | POA: Diagnosis not present

## 2023-09-28 DIAGNOSIS — J449 Chronic obstructive pulmonary disease, unspecified: Secondary | ICD-10-CM | POA: Diagnosis not present

## 2023-10-29 DIAGNOSIS — I10 Essential (primary) hypertension: Secondary | ICD-10-CM | POA: Diagnosis not present

## 2023-10-29 DIAGNOSIS — J449 Chronic obstructive pulmonary disease, unspecified: Secondary | ICD-10-CM | POA: Diagnosis not present

## 2023-11-26 DIAGNOSIS — I10 Essential (primary) hypertension: Secondary | ICD-10-CM | POA: Diagnosis not present

## 2023-11-26 DIAGNOSIS — J449 Chronic obstructive pulmonary disease, unspecified: Secondary | ICD-10-CM | POA: Diagnosis not present

## 2024-01-02 DIAGNOSIS — I700 Atherosclerosis of aorta: Secondary | ICD-10-CM | POA: Diagnosis not present

## 2024-01-02 DIAGNOSIS — F17218 Nicotine dependence, cigarettes, with other nicotine-induced disorders: Secondary | ICD-10-CM | POA: Diagnosis not present

## 2024-01-02 DIAGNOSIS — G4700 Insomnia, unspecified: Secondary | ICD-10-CM | POA: Diagnosis not present

## 2024-01-02 DIAGNOSIS — Z1389 Encounter for screening for other disorder: Secondary | ICD-10-CM | POA: Diagnosis not present

## 2024-01-02 DIAGNOSIS — F17210 Nicotine dependence, cigarettes, uncomplicated: Secondary | ICD-10-CM | POA: Diagnosis not present

## 2024-01-02 DIAGNOSIS — J449 Chronic obstructive pulmonary disease, unspecified: Secondary | ICD-10-CM | POA: Diagnosis not present

## 2024-01-02 DIAGNOSIS — Z0001 Encounter for general adult medical examination with abnormal findings: Secondary | ICD-10-CM | POA: Diagnosis not present

## 2024-01-02 DIAGNOSIS — Z89512 Acquired absence of left leg below knee: Secondary | ICD-10-CM | POA: Diagnosis not present

## 2024-01-02 DIAGNOSIS — J439 Emphysema, unspecified: Secondary | ICD-10-CM | POA: Diagnosis not present

## 2024-01-03 ENCOUNTER — Other Ambulatory Visit (HOSPITAL_COMMUNITY): Payer: Self-pay | Admitting: Gerontology

## 2024-01-03 DIAGNOSIS — Z87891 Personal history of nicotine dependence: Secondary | ICD-10-CM

## 2024-02-01 DIAGNOSIS — I10 Essential (primary) hypertension: Secondary | ICD-10-CM | POA: Diagnosis not present

## 2024-02-01 DIAGNOSIS — J449 Chronic obstructive pulmonary disease, unspecified: Secondary | ICD-10-CM | POA: Diagnosis not present

## 2024-03-03 DIAGNOSIS — J449 Chronic obstructive pulmonary disease, unspecified: Secondary | ICD-10-CM | POA: Diagnosis not present

## 2024-03-03 DIAGNOSIS — I10 Essential (primary) hypertension: Secondary | ICD-10-CM | POA: Diagnosis not present

## 2024-04-02 DIAGNOSIS — I10 Essential (primary) hypertension: Secondary | ICD-10-CM | POA: Diagnosis not present

## 2024-04-02 DIAGNOSIS — J449 Chronic obstructive pulmonary disease, unspecified: Secondary | ICD-10-CM | POA: Diagnosis not present

## 2024-04-11 DIAGNOSIS — I10 Essential (primary) hypertension: Secondary | ICD-10-CM | POA: Diagnosis not present

## 2024-04-11 DIAGNOSIS — Z0001 Encounter for general adult medical examination with abnormal findings: Secondary | ICD-10-CM | POA: Diagnosis not present

## 2024-04-11 DIAGNOSIS — Z1159 Encounter for screening for other viral diseases: Secondary | ICD-10-CM | POA: Diagnosis not present

## 2024-05-03 DIAGNOSIS — I10 Essential (primary) hypertension: Secondary | ICD-10-CM | POA: Diagnosis not present

## 2024-05-03 DIAGNOSIS — J449 Chronic obstructive pulmonary disease, unspecified: Secondary | ICD-10-CM | POA: Diagnosis not present

## 2024-06-03 DIAGNOSIS — J449 Chronic obstructive pulmonary disease, unspecified: Secondary | ICD-10-CM | POA: Diagnosis not present

## 2024-06-03 DIAGNOSIS — I10 Essential (primary) hypertension: Secondary | ICD-10-CM | POA: Diagnosis not present

## 2024-06-28 DIAGNOSIS — I10 Essential (primary) hypertension: Secondary | ICD-10-CM | POA: Diagnosis not present

## 2024-06-28 DIAGNOSIS — F17218 Nicotine dependence, cigarettes, with other nicotine-induced disorders: Secondary | ICD-10-CM | POA: Diagnosis not present

## 2024-06-28 DIAGNOSIS — J449 Chronic obstructive pulmonary disease, unspecified: Secondary | ICD-10-CM | POA: Diagnosis not present

## 2024-06-28 DIAGNOSIS — Z89512 Acquired absence of left leg below knee: Secondary | ICD-10-CM | POA: Diagnosis not present

## 2024-10-02 ENCOUNTER — Other Ambulatory Visit (HOSPITAL_COMMUNITY): Payer: Self-pay | Admitting: Gerontology

## 2024-10-02 ENCOUNTER — Ambulatory Visit (HOSPITAL_COMMUNITY)
Admission: RE | Admit: 2024-10-02 | Discharge: 2024-10-02 | Disposition: A | Discharge status: Home / Self Care | Source: Ambulatory Visit | Attending: Gerontology | Admitting: Gerontology

## 2024-10-02 DIAGNOSIS — R0789 Other chest pain: Secondary | ICD-10-CM
